# Patient Record
Sex: Female | Born: 1960 | Race: White | Hispanic: No | Marital: Married | Smoking: Former smoker
Health system: Southern US, Community
[De-identification: ages and names within clinical notes are randomized; demographics above are authoritative.]

## PROBLEM LIST (undated history)

## (undated) DIAGNOSIS — E162 Hypoglycemia, unspecified: Secondary | ICD-10-CM

## (undated) DIAGNOSIS — N2589 Other disorders resulting from impaired renal tubular function: Secondary | ICD-10-CM

## (undated) DIAGNOSIS — I4891 Unspecified atrial fibrillation: Secondary | ICD-10-CM

## (undated) DIAGNOSIS — E079 Disorder of thyroid, unspecified: Secondary | ICD-10-CM

## (undated) DIAGNOSIS — M5430 Sciatica, unspecified side: Secondary | ICD-10-CM

## (undated) DIAGNOSIS — G4730 Sleep apnea, unspecified: Secondary | ICD-10-CM

## (undated) DIAGNOSIS — I10 Essential (primary) hypertension: Secondary | ICD-10-CM

## (undated) DIAGNOSIS — I219 Acute myocardial infarction, unspecified: Secondary | ICD-10-CM

## (undated) HISTORY — PX: PLANTAR FASCIA RELEASE: SHX2239

## (undated) HISTORY — PX: RIGHT OOPHORECTOMY: SHX2359

## (undated) HISTORY — PX: BREAST CYST ASPIRATION: SHX578

## (undated) HISTORY — PX: DILATION AND CURETTAGE OF UTERUS: SHX78

## (undated) HISTORY — PX: CHOLECYSTECTOMY: SHX55

## (undated) HISTORY — PX: FRACTURE SURGERY: SHX138

## (undated) HISTORY — PX: HAND SURGERY: SHX662

## (undated) HISTORY — PX: APPENDECTOMY: SHX54

---

## 2015-06-27 ENCOUNTER — Other Ambulatory Visit: Payer: Self-pay | Admitting: Obstetrics and Gynecology

## 2015-06-27 DIAGNOSIS — N950 Postmenopausal bleeding: Secondary | ICD-10-CM

## 2015-07-09 ENCOUNTER — Ambulatory Visit: Payer: Self-pay

## 2015-07-18 ENCOUNTER — Ambulatory Visit
Admission: RE | Admit: 2015-07-18 | Discharge: 2015-07-18 | Disposition: A | Discharge status: Home / Self Care | Payer: BLUE CROSS/BLUE SHIELD | Source: Ambulatory Visit | Attending: Obstetrics and Gynecology | Admitting: Obstetrics and Gynecology

## 2015-07-18 DIAGNOSIS — N950 Postmenopausal bleeding: Secondary | ICD-10-CM | POA: Insufficient documentation

## 2015-07-18 DIAGNOSIS — Z780 Asymptomatic menopausal state: Secondary | ICD-10-CM | POA: Diagnosis not present

## 2015-09-11 ENCOUNTER — Ambulatory Visit
Admission: RE | Admit: 2015-09-11 | Discharge: 2015-09-11 | Disposition: A | Discharge status: Home / Self Care | Payer: BLUE CROSS/BLUE SHIELD | Source: Ambulatory Visit | Attending: Unknown Physician Specialty | Admitting: Unknown Physician Specialty

## 2015-09-11 ENCOUNTER — Other Ambulatory Visit: Payer: Self-pay | Admitting: Unknown Physician Specialty

## 2015-09-11 DIAGNOSIS — M79652 Pain in left thigh: Secondary | ICD-10-CM | POA: Insufficient documentation

## 2015-09-11 DIAGNOSIS — M25452 Effusion, left hip: Secondary | ICD-10-CM

## 2015-09-21 ENCOUNTER — Other Ambulatory Visit: Payer: Self-pay

## 2015-09-21 ENCOUNTER — Emergency Department: Payer: BLUE CROSS/BLUE SHIELD

## 2015-09-21 ENCOUNTER — Inpatient Hospital Stay
Admission: EM | Admit: 2015-09-21 | Discharge: 2015-09-23 | DRG: 287 | Disposition: A | Discharge status: Home / Self Care | Payer: BLUE CROSS/BLUE SHIELD | Attending: Internal Medicine | Admitting: Internal Medicine

## 2015-09-21 ENCOUNTER — Encounter: Payer: Self-pay | Admitting: Emergency Medicine

## 2015-09-21 DIAGNOSIS — R7989 Other specified abnormal findings of blood chemistry: Secondary | ICD-10-CM

## 2015-09-21 DIAGNOSIS — G4730 Sleep apnea, unspecified: Secondary | ICD-10-CM | POA: Diagnosis present

## 2015-09-21 DIAGNOSIS — R1013 Epigastric pain: Secondary | ICD-10-CM | POA: Diagnosis not present

## 2015-09-21 DIAGNOSIS — I214 Non-ST elevation (NSTEMI) myocardial infarction: Secondary | ICD-10-CM | POA: Diagnosis present

## 2015-09-21 DIAGNOSIS — E039 Hypothyroidism, unspecified: Secondary | ICD-10-CM | POA: Diagnosis present

## 2015-09-21 DIAGNOSIS — I480 Paroxysmal atrial fibrillation: Secondary | ICD-10-CM | POA: Diagnosis present

## 2015-09-21 DIAGNOSIS — Z800 Family history of malignant neoplasm of digestive organs: Secondary | ICD-10-CM

## 2015-09-21 DIAGNOSIS — I248 Other forms of acute ischemic heart disease: Principal | ICD-10-CM | POA: Diagnosis present

## 2015-09-21 DIAGNOSIS — Z9049 Acquired absence of other specified parts of digestive tract: Secondary | ICD-10-CM

## 2015-09-21 DIAGNOSIS — Z8249 Family history of ischemic heart disease and other diseases of the circulatory system: Secondary | ICD-10-CM

## 2015-09-21 DIAGNOSIS — M5430 Sciatica, unspecified side: Secondary | ICD-10-CM | POA: Diagnosis present

## 2015-09-21 DIAGNOSIS — R778 Other specified abnormalities of plasma proteins: Secondary | ICD-10-CM

## 2015-09-21 DIAGNOSIS — K30 Functional dyspepsia: Secondary | ICD-10-CM

## 2015-09-21 DIAGNOSIS — I10 Essential (primary) hypertension: Secondary | ICD-10-CM | POA: Diagnosis present

## 2015-09-21 DIAGNOSIS — Z9889 Other specified postprocedural states: Secondary | ICD-10-CM

## 2015-09-21 DIAGNOSIS — Z90721 Acquired absence of ovaries, unilateral: Secondary | ICD-10-CM

## 2015-09-21 DIAGNOSIS — K589 Irritable bowel syndrome without diarrhea: Secondary | ICD-10-CM | POA: Diagnosis present

## 2015-09-21 DIAGNOSIS — Z888 Allergy status to other drugs, medicaments and biological substances status: Secondary | ICD-10-CM

## 2015-09-21 DIAGNOSIS — Z87891 Personal history of nicotine dependence: Secondary | ICD-10-CM

## 2015-09-21 DIAGNOSIS — E669 Obesity, unspecified: Secondary | ICD-10-CM | POA: Diagnosis present

## 2015-09-21 HISTORY — DX: Essential (primary) hypertension: I10

## 2015-09-21 HISTORY — DX: Other disorders resulting from impaired renal tubular function: N25.89

## 2015-09-21 HISTORY — DX: Sleep apnea, unspecified: G47.30

## 2015-09-21 HISTORY — DX: Hypoglycemia, unspecified: E16.2

## 2015-09-21 HISTORY — DX: Disorder of thyroid, unspecified: E07.9

## 2015-09-21 HISTORY — DX: Unspecified atrial fibrillation: I48.91

## 2015-09-21 HISTORY — DX: Sciatica, unspecified side: M54.30

## 2015-09-21 LAB — TROPONIN I

## 2015-09-21 LAB — BASIC METABOLIC PANEL
ANION GAP: 9 (ref 5–15)
BUN: 15 mg/dL (ref 6–20)
CALCIUM: 9.1 mg/dL (ref 8.9–10.3)
CO2: 25 mmol/L (ref 22–32)
Chloride: 106 mmol/L (ref 101–111)
Creatinine, Ser: 0.82 mg/dL (ref 0.44–1.00)
Glucose, Bld: 102 mg/dL — ABNORMAL HIGH (ref 65–99)
POTASSIUM: 4.2 mmol/L (ref 3.5–5.1)
Sodium: 140 mmol/L (ref 135–145)

## 2015-09-21 LAB — CBC
HEMATOCRIT: 42.6 % (ref 35.0–47.0)
HEMOGLOBIN: 14.3 g/dL (ref 12.0–16.0)
MCH: 30.8 pg (ref 26.0–34.0)
MCHC: 33.6 g/dL (ref 32.0–36.0)
MCV: 91.6 fL (ref 80.0–100.0)
Platelets: 233 10*3/uL (ref 150–440)
RBC: 4.66 MIL/uL (ref 3.80–5.20)
RDW: 13.3 % (ref 11.5–14.5)
WBC: 7 10*3/uL (ref 3.6–11.0)

## 2015-09-21 NOTE — ED Notes (Signed)
Pt reports she was at the movies when she developed "chest discomfort" and a feeling of indigestion and feeling like she needed to have a BM. Pt reports she was dx'd with afib back in April. Pt is supposed to be on Eliquis but has not started taking it yet. Pt denies n/v, shortness of breath or diaphoresis with the discomfort.

## 2015-09-22 ENCOUNTER — Other Ambulatory Visit: Payer: Self-pay

## 2015-09-22 DIAGNOSIS — K589 Irritable bowel syndrome without diarrhea: Secondary | ICD-10-CM | POA: Diagnosis present

## 2015-09-22 DIAGNOSIS — Z9889 Other specified postprocedural states: Secondary | ICD-10-CM | POA: Diagnosis not present

## 2015-09-22 DIAGNOSIS — I248 Other forms of acute ischemic heart disease: Secondary | ICD-10-CM | POA: Diagnosis present

## 2015-09-22 DIAGNOSIS — G4730 Sleep apnea, unspecified: Secondary | ICD-10-CM | POA: Diagnosis present

## 2015-09-22 DIAGNOSIS — Z800 Family history of malignant neoplasm of digestive organs: Secondary | ICD-10-CM | POA: Diagnosis not present

## 2015-09-22 DIAGNOSIS — R1013 Epigastric pain: Secondary | ICD-10-CM | POA: Diagnosis present

## 2015-09-22 DIAGNOSIS — Z90721 Acquired absence of ovaries, unilateral: Secondary | ICD-10-CM | POA: Diagnosis not present

## 2015-09-22 DIAGNOSIS — M5430 Sciatica, unspecified side: Secondary | ICD-10-CM | POA: Diagnosis present

## 2015-09-22 DIAGNOSIS — Z888 Allergy status to other drugs, medicaments and biological substances status: Secondary | ICD-10-CM | POA: Diagnosis not present

## 2015-09-22 DIAGNOSIS — I214 Non-ST elevation (NSTEMI) myocardial infarction: Secondary | ICD-10-CM | POA: Diagnosis present

## 2015-09-22 DIAGNOSIS — E669 Obesity, unspecified: Secondary | ICD-10-CM | POA: Diagnosis present

## 2015-09-22 DIAGNOSIS — Z87891 Personal history of nicotine dependence: Secondary | ICD-10-CM | POA: Diagnosis not present

## 2015-09-22 DIAGNOSIS — I10 Essential (primary) hypertension: Secondary | ICD-10-CM | POA: Diagnosis present

## 2015-09-22 DIAGNOSIS — Z8249 Family history of ischemic heart disease and other diseases of the circulatory system: Secondary | ICD-10-CM | POA: Diagnosis not present

## 2015-09-22 DIAGNOSIS — Z9049 Acquired absence of other specified parts of digestive tract: Secondary | ICD-10-CM | POA: Diagnosis not present

## 2015-09-22 DIAGNOSIS — I480 Paroxysmal atrial fibrillation: Secondary | ICD-10-CM | POA: Diagnosis present

## 2015-09-22 DIAGNOSIS — E039 Hypothyroidism, unspecified: Secondary | ICD-10-CM | POA: Diagnosis present

## 2015-09-22 LAB — URINALYSIS COMPLETE WITH MICROSCOPIC (ARMC ONLY)
BILIRUBIN URINE: NEGATIVE
Glucose, UA: NEGATIVE mg/dL
Ketones, ur: NEGATIVE mg/dL
LEUKOCYTES UA: NEGATIVE
NITRITE: NEGATIVE
PH: 6 (ref 5.0–8.0)
PROTEIN: NEGATIVE mg/dL
Specific Gravity, Urine: 1.006 (ref 1.005–1.030)

## 2015-09-22 LAB — GLUCOSE, CAPILLARY
Glucose-Capillary: 120 mg/dL — ABNORMAL HIGH (ref 65–99)
Glucose-Capillary: 133 mg/dL — ABNORMAL HIGH (ref 65–99)
Glucose-Capillary: 120 mg/dL — ABNORMAL HIGH (ref 65–99)
Glucose-Capillary: 86 mg/dL (ref 65–99)

## 2015-09-22 LAB — HEPATIC FUNCTION PANEL
ALBUMIN: 4.4 g/dL (ref 3.5–5.0)
ALK PHOS: 75 U/L (ref 38–126)
ALT: 27 U/L (ref 14–54)
AST: 26 U/L (ref 15–41)
BILIRUBIN TOTAL: 0.3 mg/dL (ref 0.3–1.2)
Bilirubin, Direct: 0.1 mg/dL (ref 0.1–0.5)
Indirect Bilirubin: 0.2 mg/dL — ABNORMAL LOW (ref 0.3–0.9)
TOTAL PROTEIN: 7.3 g/dL (ref 6.5–8.1)

## 2015-09-22 LAB — TROPONIN I
Troponin I: 0.21 ng/mL — ABNORMAL HIGH (ref <0.031)
Troponin I: 0.56 ng/mL — ABNORMAL HIGH (ref <0.031)
Troponin I: 0.88 ng/mL — ABNORMAL HIGH (ref <0.031)
Troponin I: 0.71 ng/mL — ABNORMAL HIGH (ref <0.031)

## 2015-09-22 LAB — LIPASE, BLOOD: LIPASE: 29 U/L (ref 11–51)

## 2015-09-22 LAB — TSH: TSH: 4.315 u[IU]/mL (ref 0.350–4.500)

## 2015-09-22 LAB — HEMOGLOBIN A1C: Hgb A1c MFr Bld: 5.5 % (ref 4.0–6.0)

## 2015-09-22 MED ORDER — SODIUM CHLORIDE 0.9% FLUSH: 3.0000 mL | Freq: Two times a day (BID) | INTRAVENOUS | Status: DC

## 2015-09-22 MED ORDER — SODIUM CHLORIDE 0.9% FLUSH
3.0000 mL | Freq: Two times a day (BID) | INTRAVENOUS | Status: DC
  Administered 2015-09-22: 3 mL via INTRAVENOUS

## 2015-09-22 MED ORDER — ASPIRIN 81 MG PO CHEW
81.0000 mg | CHEWABLE_TABLET | ORAL | Status: AC
  Administered 2015-09-23: 81 mg via ORAL
  Filled 2015-09-22: qty 1

## 2015-09-22 MED ORDER — SODIUM CHLORIDE 0.9 % IV SOLN: 250.0000 mL | INTRAVENOUS | Status: DC | PRN

## 2015-09-22 MED ORDER — ONDANSETRON HCL 4 MG/2ML IJ SOLN: 4.0000 mg | Freq: Four times a day (QID) | INTRAMUSCULAR | Status: DC | PRN

## 2015-09-22 MED ORDER — SODIUM CHLORIDE 0.9 % WEIGHT BASED INFUSION
3.0000 mL/kg/h | INTRAVENOUS | Status: AC
  Administered 2015-09-23: 3 mL/kg/h via INTRAVENOUS

## 2015-09-22 MED ORDER — ACETAMINOPHEN 325 MG PO TABS: 650.0000 mg | ORAL_TABLET | Freq: Four times a day (QID) | ORAL | Status: DC | PRN

## 2015-09-22 MED ORDER — ASPIRIN 81 MG PO CHEW
CHEWABLE_TABLET | ORAL | Status: AC
  Administered 2015-09-22: 324 mg via ORAL
  Filled 2015-09-22: qty 4

## 2015-09-22 MED ORDER — DOCUSATE SODIUM 100 MG PO CAPS
100.0000 mg | ORAL_CAPSULE | Freq: Two times a day (BID) | ORAL | Status: DC
  Administered 2015-09-22: 100 mg via ORAL
  Filled 2015-09-22 (×2): qty 1

## 2015-09-22 MED ORDER — SODIUM CHLORIDE 0.9 % WEIGHT BASED INFUSION: 1.0000 mL/kg/h | INTRAVENOUS | Status: DC

## 2015-09-22 MED ORDER — MORPHINE SULFATE (PF) 2 MG/ML IV SOLN
2.0000 mg | INTRAVENOUS | Status: DC | PRN
  Filled 2015-09-22: qty 1

## 2015-09-22 MED ORDER — SODIUM CHLORIDE 0.9% FLUSH: 3.0000 mL | INTRAVENOUS | Status: DC | PRN

## 2015-09-22 MED ORDER — ASPIRIN 81 MG PO CHEW
324.0000 mg | CHEWABLE_TABLET | Freq: Once | ORAL | Status: AC
  Administered 2015-09-22: 324 mg via ORAL

## 2015-09-22 MED ORDER — ACETAMINOPHEN 650 MG RE SUPP: 650.0000 mg | Freq: Four times a day (QID) | RECTAL | Status: DC | PRN

## 2015-09-22 MED ORDER — ONDANSETRON HCL 4 MG PO TABS: 4.0000 mg | ORAL_TABLET | Freq: Four times a day (QID) | ORAL | Status: DC | PRN

## 2015-09-22 MED ORDER — ENOXAPARIN SODIUM 150 MG/ML ~~LOC~~ SOLN
1.0000 mg/kg | Freq: Two times a day (BID) | SUBCUTANEOUS | Status: DC
  Administered 2015-09-22 (×2): 145 mg via SUBCUTANEOUS
  Filled 2015-09-22 (×4): qty 0.96

## 2015-09-22 MED ORDER — LEVOTHYROXINE SODIUM 112 MCG PO TABS
112.0000 ug | ORAL_TABLET | Freq: Every day | ORAL | Status: DC
  Administered 2015-09-22 – 2015-09-23 (×2): 112 ug via ORAL
  Filled 2015-09-22 (×2): qty 1

## 2015-09-22 NOTE — ED Notes (Signed)
Called FN to allow this patient's friend to come back.

## 2015-09-22 NOTE — ED Notes (Signed)
Lab called in critical value of 0.21 Trop, notifying MD.

## 2015-09-22 NOTE — Progress Notes (Signed)
Michelle Rivers is a 55 y.o. female  [id number]  Primary Cardiologist: Adrian Blackwater Reason for Consultation: Chest pain  HPI: 55 year old white female with history of paroxysmal atrial fibrillation and hypothyroidism presented to the hospital with lower precordial chest pain bilaterally described as pressure type bicarbonate which was very tight. She had elevated troponin thus I was asked to evaluate the patient she denies any chest pain at this time.   Review of Systems: No orthopnea PND or leg swelling   Past Medical History  Diagnosis Date  . Sleep apnea   . Sciatica   . Atrial fibrillation (HCC)   . Hypoglycemia   . Renal tubular acidosis   . Renal tubular acidosis   . Hypertension   . Thyroid disease     Medications Prior to Admission  Medication Sig Dispense Refill  . acetaminophen (TYLENOL) 500 MG tablet Take 500 mg by mouth every 6 (six) hours as needed for mild pain, fever or headache.    . levothyroxine (SYNTHROID, LEVOTHROID) 112 MCG tablet Take 112 mcg by mouth daily.       Marland Kitchen docusate sodium  100 mg Oral BID  . enoxaparin (LOVENOX) injection  1 mg/kg Subcutaneous Q12H  . levothyroxine  112 mcg Oral QAC breakfast  . sodium chloride flush  3 mL Intravenous Q12H    Infusions:    Allergies  Allergen Reactions  . Demerol [Meperidine] Nausea And Vomiting    Social History   Social History  . Marital Status: Married    Spouse Name: N/A  . Number of Children: N/A  . Years of Education: N/A   Occupational History  . Not on file.   Social History Main Topics  . Smoking status: Former Games developer  . Smokeless tobacco: Not on file  . Alcohol Use: Not on file  . Drug Use: Not on file  . Sexual Activity: Not on file   Other Topics Concern  . Not on file   Social History Narrative    Family History  Problem Relation Age of Onset  . CAD Mother   . CAD Father   . Liver cancer Brother     PHYSICAL EXAM: Filed Vitals:   09/22/15 0349 09/22/15 0800   BP: 145/43 117/47  Pulse: 73 69  Temp: 97.5 F (36.4 C) 98.2 F (36.8 C)  Resp: 16 16    No intake or output data in the 24 hours ending 09/22/15 1103  General:  Well appearing. No respiratory difficulty HEENT: normal Neck: supple. no JVD. Carotids 2+ bilat; no bruits. No lymphadenopathy or thryomegaly appreciated. Cor: PMI nondisplaced. Regular rate & rhythm. No rubs, gallops or murmurs. Lungs: clear Abdomen: soft, nontender, nondistended. No hepatosplenomegaly. No bruits or masses. Good bowel sounds. Extremities: no cyanosis, clubbing, rash, edema Neuro: alert & oriented x 3, cranial nerves grossly intact. moves all 4 extremities w/o difficulty. Affect pleasant.  ECG: Sinus rhythm no acute changes  Results for orders placed or performed during the hospital encounter of 09/21/15 (from the past 24 hour(s))  Basic metabolic panel     Status: Abnormal   Collection Time: 09/21/15  8:53 PM  Result Value Ref Range   Sodium 140 135 - 145 mmol/L   Potassium 4.2 3.5 - 5.1 mmol/L   Chloride 106 101 - 111 mmol/L   CO2 25 22 - 32 mmol/L   Glucose, Bld 102 (H) 65 - 99 mg/dL   BUN 15 6 - 20 mg/dL   Creatinine, Ser 4.09 0.44 - 1.00  mg/dL   Calcium 9.1 8.9 - 24.4 mg/dL   GFR calc non Af Amer >60 >60 mL/min   GFR calc Af Amer >60 >60 mL/min   Anion gap 9 5 - 15  CBC     Status: None   Collection Time: 09/21/15  8:53 PM  Result Value Ref Range   WBC 7.0 3.6 - 11.0 K/uL   RBC 4.66 3.80 - 5.20 MIL/uL   Hemoglobin 14.3 12.0 - 16.0 g/dL   HCT 62.8 63.8 - 17.7 %   MCV 91.6 80.0 - 100.0 fL   MCH 30.8 26.0 - 34.0 pg   MCHC 33.6 32.0 - 36.0 g/dL   RDW 11.6 57.9 - 03.8 %   Platelets 233 150 - 440 K/uL  Troponin I     Status: None   Collection Time: 09/21/15  8:53 PM  Result Value Ref Range   Troponin I <0.03 <0.031 ng/mL  Hepatic function panel     Status: Abnormal   Collection Time: 09/21/15  8:53 PM  Result Value Ref Range   Total Protein 7.3 6.5 - 8.1 g/dL   Albumin 4.4 3.5 - 5.0  g/dL   AST 26 15 - 41 U/L   ALT 27 14 - 54 U/L   Alkaline Phosphatase 75 38 - 126 U/L   Total Bilirubin 0.3 0.3 - 1.2 mg/dL   Bilirubin, Direct 0.1 0.1 - 0.5 mg/dL   Indirect Bilirubin 0.2 (L) 0.3 - 0.9 mg/dL  Lipase, blood     Status: None   Collection Time: 09/21/15  8:53 PM  Result Value Ref Range   Lipase 29 11 - 51 U/L  Urinalysis complete, with microscopic ([facility] only)     Status: Abnormal   Collection Time: 09/22/15 12:31 AM  Result Value Ref Range   Color, Urine STRAW (A) YELLOW   APPearance CLEAR (A) CLEAR   Glucose, UA NEGATIVE NEGATIVE mg/dL   Bilirubin Urine NEGATIVE NEGATIVE   Ketones, ur NEGATIVE NEGATIVE mg/dL   Specific Gravity, Urine 1.006 1.005 - 1.030   Hgb urine dipstick 1+ (A) NEGATIVE   pH 6.0 5.0 - 8.0   Protein, ur NEGATIVE NEGATIVE mg/dL   Nitrite NEGATIVE NEGATIVE   Leukocytes, UA NEGATIVE NEGATIVE   RBC / HPF 0-5 0 - 5 RBC/hpf   WBC, UA 0-5 0 - 5 WBC/hpf   Bacteria, UA RARE (A) NONE SEEN   Squamous Epithelial / LPF 0-5 (A) NONE SEEN  Troponin I     Status: Abnormal   Collection Time: 09/22/15 12:45 AM  Result Value Ref Range   Troponin I 0.21 (H) <0.031 ng/mL  Glucose, capillary     Status: Abnormal   Collection Time: 09/22/15  3:54 AM  Result Value Ref Range   Glucose-Capillary 120 (H) 65 - 99 mg/dL  TSH     Status: None   Collection Time: 09/22/15  5:18 AM  Result Value Ref Range   TSH 4.315 0.350 - 4.500 uIU/mL  Troponin I     Status: Abnormal   Collection Time: 09/22/15  5:18 AM  Result Value Ref Range   Troponin I 0.56 (H) <0.031 ng/mL  Glucose, capillary     Status: Abnormal   Collection Time: 09/22/15  6:07 AM  Result Value Ref Range   Glucose-Capillary 133 (H) 65 - 99 mg/dL  Troponin I     Status: Abnormal   Collection Time: 09/22/15  9:42 AM  Result Value Ref Range   Troponin I 0.88 (H) <0.031 ng/mL  Dg Chest 2 View  09/21/2015  CLINICAL DATA:  Pt reports she was at the movies when she developed "chest discomfort" and a  feeling of indigestion and feeling like she needed to have a BM. Pt reports she was dx'd with afib back in April. Pt is supposed to be on Eliquis but has not started taking it yet. Pt denies n/v, shortness of breath or diaphoresis with the discomfort EXAM: CHEST  2 VIEW COMPARISON:  None. FINDINGS: The heart size and mediastinal contours are within normal limits. Both lungs are clear. No pleural effusion or pneumothorax. The visualized skeletal structures are unremarkable. IMPRESSION: No active cardiopulmonary disease. Electronically Signed   By: Amie Portland M.D.   On: 09/21/2015 21:45     ASSESSMENT AND PLAN:Non-STEMI with elevated troponin no ST changes on EKG discussed fully with the patient about risk and benefits of cardiac catheterization and patient has agreed to having cardiac catheterization which will be set up tomorrow afternoon.  Sinead Hockman A

## 2015-09-22 NOTE — ED Notes (Signed)
PT was taken to room 234.

## 2015-09-22 NOTE — H&P (Signed)
Michelle Rivers is an 55 y.o. female.   Chief Complaint: Chest tightness HPI: The patient with past medical history of paroxysmal A. fib, hypothyroidism and IBS presents emergency department complaining of chest tightness. She states that she been seeing a movie and had returned from the restroom after walking up the slight incline to her seat when she felt tightness in her chest and her epigastrium that she states felt like "her bra strap was too tight". She states the feeling lasted for approximately 15 minutes. There was no radiation of the discomfort. She denies any associated shortness of breath, nausea, vomiting or diaphoresis. The patient also denies palpitations. She admits to feeling as if it may be indigestion and belched a number of times. This did not specifically relieve her pain. Notably, the patient states that she felt as if she may have to have a bowel movement and that moment. This sensation dissipated momentarily and lasted a much shorter time than her chest tightness. She had another episode of chest tightness in the emergency department. This time she was able to have a bowel movement that she states was normal in color, caliber and consistency. She admits that the volume of stool was slightly more than usual. Also, she admits to having eaten a large amount of blueberries, blackberries and raspberries today. Notably she denies any abdominal pain. Laboratory evaluation in the emergency department revealed an elevated troponin. Due to her vague symptoms and elevated troponin in the emergency department staff called for admission.  Past Medical History  Diagnosis Date  . Sleep apnea   . Sciatica   . Atrial fibrillation ([place])   . Hypoglycemia   . Renal tubular acidosis   . Renal tubular acidosis   . Hypertension   . Thyroid disease     Past Surgical History  Procedure Laterality Date  . Cholecystectomy    . Right oophorectomy    . Dilation and curettage of uterus    .  Appendectomy    . Fracture surgery    . Hand surgery    . Plantar fascia release      Family History  Problem Relation Age of Onset  . CAD Mother   . CAD Father   . Liver cancer Brother    Social History:  reports that she has quit smoking. She does not have any smokeless tobacco history on file. Her alcohol and drug histories are not on file.  Allergies:  Allergies  Allergen Reactions  . Demerol [Meperidine] Nausea And Vomiting    Medications Prior to Admission  Medication Sig Dispense Refill  . acetaminophen (TYLENOL) 500 MG tablet Take 500 mg by mouth every 6 (six) hours as needed for mild pain, fever or headache.    . levothyroxine (SYNTHROID, LEVOTHROID) 112 MCG tablet Take 112 mcg by mouth daily.      Results for orders placed or performed during the hospital encounter of 09/21/15 (from the past 48 hour(s))  Basic metabolic panel     Status: Abnormal   Collection Time: 09/21/15  8:53 PM  Result Value Ref Range   Sodium 140 135 - 145 mmol/L   Potassium 4.2 3.5 - 5.1 mmol/L   Chloride 106 101 - 111 mmol/L   CO2 25 22 - 32 mmol/L   Glucose, Bld 102 (H) 65 - 99 mg/dL   BUN 15 6 - 20 mg/dL   Creatinine, Ser 0.82 0.44 - 1.00 mg/dL   Calcium 9.1 8.9 - 10.3 mg/dL   GFR calc non Af  Amer >60 >60 mL/min   GFR calc Af Amer >60 >60 mL/min    Comment: (NOTE) The eGFR has been calculated using the CKD EPI equation. This calculation has not been validated in all clinical situations. eGFR's persistently <60 mL/min signify possible Chronic Kidney Disease.    Anion gap 9 5 - 15  CBC     Status: None   Collection Time: 09/21/15  8:53 PM  Result Value Ref Range   WBC 7.0 3.6 - 11.0 K/uL   RBC 4.66 3.80 - 5.20 MIL/uL   Hemoglobin 14.3 12.0 - 16.0 g/dL   HCT 42.6 35.0 - 47.0 %   MCV 91.6 80.0 - 100.0 fL   MCH 30.8 26.0 - 34.0 pg   MCHC 33.6 32.0 - 36.0 g/dL   RDW 13.3 11.5 - 14.5 %   Platelets 233 150 - 440 K/uL  Troponin I     Status: None   Collection Time: 09/21/15  8:53  PM  Result Value Ref Range   Troponin I <0.03 <0.031 ng/mL    Comment:        NO INDICATION OF MYOCARDIAL INJURY.   Hepatic function panel     Status: Abnormal   Collection Time: 09/21/15  8:53 PM  Result Value Ref Range   Total Protein 7.3 6.5 - 8.1 g/dL   Albumin 4.4 3.5 - 5.0 g/dL   AST 26 15 - 41 U/L   ALT 27 14 - 54 U/L   Alkaline Phosphatase 75 38 - 126 U/L   Total Bilirubin 0.3 0.3 - 1.2 mg/dL   Bilirubin, Direct 0.1 0.1 - 0.5 mg/dL   Indirect Bilirubin 0.2 (L) 0.3 - 0.9 mg/dL  Lipase, blood     Status: None   Collection Time: 09/21/15  8:53 PM  Result Value Ref Range   Lipase 29 11 - 51 U/L  Urinalysis complete, with microscopic ([facility] only)     Status: Abnormal   Collection Time: 09/22/15 12:31 AM  Result Value Ref Range   Color, Urine STRAW (A) YELLOW   APPearance CLEAR (A) CLEAR   Glucose, UA NEGATIVE NEGATIVE mg/dL   Bilirubin Urine NEGATIVE NEGATIVE   Ketones, ur NEGATIVE NEGATIVE mg/dL   Specific Gravity, Urine 1.006 1.005 - 1.030   Hgb urine dipstick 1+ (A) NEGATIVE   pH 6.0 5.0 - 8.0   Protein, ur NEGATIVE NEGATIVE mg/dL   Nitrite NEGATIVE NEGATIVE   Leukocytes, UA NEGATIVE NEGATIVE   RBC / HPF 0-5 0 - 5 RBC/hpf   WBC, UA 0-5 0 - 5 WBC/hpf   Bacteria, UA RARE (A) NONE SEEN   Squamous Epithelial / LPF 0-5 (A) NONE SEEN  Troponin I     Status: Abnormal   Collection Time: 09/22/15 12:45 AM  Result Value Ref Range   Troponin I 0.21 (H) <0.031 ng/mL    Comment: READ BACK AND VERIFIED WITH DAVID WALKER AT 0114 09/22/15.PMH        PERSISTENTLY INCREASED TROPONIN VALUES IN THE RANGE OF 0.04-0.49 ng/mL CAN BE SEEN IN:       -UNSTABLE ANGINA       -CONGESTIVE HEART FAILURE       -MYOCARDITIS       -CHEST TRAUMA       -ARRYHTHMIAS       -LATE PRESENTING MYOCARDIAL INFARCTION       -COPD   CLINICAL FOLLOW-UP RECOMMENDED.   Glucose, capillary     Status: Abnormal   Collection Time: 09/22/15  3:54 AM  Result Value Ref Range   Glucose-Capillary 120 (H) 65 -  99 mg/dL  Glucose, capillary     Status: Abnormal   Collection Time: 09/22/15  6:07 AM  Result Value Ref Range   Glucose-Capillary 133 (H) 65 - 99 mg/dL   Dg Chest 2 View  09/21/2015  CLINICAL DATA:  Pt reports she was at the movies when she developed "chest discomfort" and a feeling of indigestion and feeling like she needed to have a BM. Pt reports she was dx'd with afib back in April. Pt is supposed to be on Eliquis but has not started taking it yet. Pt denies n/v, shortness of breath or diaphoresis with the discomfort EXAM: CHEST  2 VIEW COMPARISON:  None. FINDINGS: The heart size and mediastinal contours are within normal limits. Both lungs are clear. No pleural effusion or pneumothorax. The visualized skeletal structures are unremarkable. IMPRESSION: No active cardiopulmonary disease. Electronically Signed   By: Lajean Manes M.D.   On: 09/21/2015 21:45    Review of Systems  Constitutional: Negative for fever and chills.  HENT: Negative for sore throat and tinnitus.   Eyes: Negative for blurred vision and redness.  Respiratory: Negative for cough and shortness of breath.   Cardiovascular: Negative for chest pain (tightness; see HPI), palpitations, orthopnea and PND.  Gastrointestinal: Negative for nausea, vomiting, abdominal pain and diarrhea.  Genitourinary: Negative for dysuria, urgency and frequency.  Musculoskeletal: Negative for myalgias and joint pain.  Skin: Negative for rash.       No lesions  Neurological: Negative for speech change, focal weakness and weakness.  Endo/Heme/Allergies: Does not bruise/bleed easily.       No temperature intolerance  Psychiatric/Behavioral: Negative for depression and suicidal ideas.    Blood pressure 145/43, pulse 73, temperature 97.5 F (36.4 C), temperature source Oral, resp. rate 16, height '5\' 6"'  (1.676 m), weight 144.198 kg (317 lb 14.4 oz), SpO2 97 %. Physical Exam  Constitutional: She is oriented to person, place, and time. She appears  well-developed and well-nourished. No distress.  HENT:  Head: Normocephalic and atraumatic.  Mouth/Throat: Oropharynx is clear and moist.  Eyes: Conjunctivae and EOM are normal. Pupils are equal, round, and reactive to light. No scleral icterus.  Neck: Normal range of motion. Neck supple. No JVD present. No tracheal deviation present. No thyromegaly present.  Cardiovascular: Normal rate, regular rhythm and normal heart sounds.  Exam reveals no gallop and no friction rub.   No murmur heard. Respiratory: Effort normal and breath sounds normal. No respiratory distress.  GI: Soft. Bowel sounds are normal. She exhibits no distension. There is no tenderness.  Genitourinary:  Deferred  Musculoskeletal: Normal range of motion. She exhibits edema.  Lymphadenopathy:    She has no cervical adenopathy.  Neurological: She is alert and oriented to person, place, and time. No cranial nerve deficit.  Skin: Skin is warm and dry. No rash noted. No erythema.  Psychiatric: She has a normal mood and affect. Her behavior is normal. Judgment and thought content normal.     Assessment/Plan This is a 55 year old female admitted for NSTEMI. 1. NSTEMI: Elevation in troponin without indications of ischemia on EKG. The patient denies any ongoing chest pain although she has had another episode of feeling as if she may have to have diarrhea. Place the patient on telemetry. Follow cardiac enzymes area start therapeutic Lovenox. Cardiology consulted. 2. Hypothyroidism: Continue Synthroid 3. DVT prophylaxis: As above 4. GI prophylaxis: None The patient is a full code. Time spent  on admission was inpatient care approximately 45 minutes  Harrie Foreman, MD 09/22/2015, 6:10 AM

## 2015-09-22 NOTE — ED Provider Notes (Signed)
[facility] Emergency Department Provider Note   ____________________________________________  Time seen: Approximately 0000 AM  I have reviewed the triage vital signs and the nursing notes.   HISTORY  Chief Complaint Chest Pain    HPI Michelle Rivers is a 55 y.o. female who comes into the hospital today with some pain in her lower chest. The patient reports that she was at the movies tonight and had about 5 piece of popcorn. She went to the bathroom and reports that she started having some indigestion. The patient felt as though her bra strap is too tight. She reports that she would not call it chest pain but more discomfort in her lower chest or upper abdomen. The patient has a history of atrial fibrillation but has not been taking her medicines because she is nervous about taking pills. The patient did take 2 Gaviscon for her indigestion but reports that it did not help her symptoms as quickly as typical. The patient reports that she was belching a lot as well. The patient decided to come into the hospital to get checked out. She reports that while she was waiting the symptoms improved. The patient denies any shortness of breath any sweats any nausea or vomiting. She reports that she did have an urge to have a bowel movement but that was the only other symptom she had. The patient is here for evaluation.   Past Medical History  Diagnosis Date  . Sleep apnea   . Sciatica   . Atrial fibrillation (HCC)   . Hypoglycemia   . Renal tubular acidosis   . Renal tubular acidosis   . Hypertension   . Thyroid disease     Patient Active Problem List   Diagnosis Date Noted  . NSTEMI (non-ST elevated myocardial infarction) (HCC) 09/22/2015    Past Surgical History  Procedure Laterality Date  . Cholecystectomy    . Right oophorectomy    . Dilation and curettage of uterus    . Appendectomy    . Fracture surgery    . Hand surgery    . Plantar fascia release        Current Outpatient Rx  Name  Route  Sig  Dispense  Refill  . acetaminophen (TYLENOL) 500 MG tablet   Oral   Take 500 mg by mouth every 6 (six) hours as needed for mild pain, fever or headache.         . levothyroxine (SYNTHROID, LEVOTHROID) 112 MCG tablet   Oral   Take 112 mcg by mouth daily.           Allergies Demerol  No family history on file.  Social History Social History  Substance Use Topics  . Smoking status: Former   . Smokeless tobacco: None  . Alcohol Use: None    Review of Systems Constitutional: No fever/chills Eyes: No visual changes. ENT: No sore throat. Cardiovascular: Denies chest pain. Respiratory: Denies shortness of breath. Gastrointestinal: Indigestion, No abdominal pain.  No nausea, no vomiting.  No diarrhea.  No constipation. Genitourinary: Negative for dysuria. Musculoskeletal: Negative for back pain. Skin: Negative for rash. Neurological: Negative for headaches, focal weakness or numbness.  10-point ROS otherwise negative.  ____________________________________________   PHYSICAL EXAM:  VITAL SIGNS: ED Triage Vitals  Enc Vitals Group     BP 09/21/15 2035 171/68 mmHg     Pulse Rate 09/21/15 2035 81     Resp 09/21/15 2035 18     Temp 09/21/15 2035 97.7 F (36.5  C)     Temp Source 09/21/15 2035 Oral     SpO2 09/21/15 2035 97 %     Weight 09/21/15 2035 315 lb (142.883 kg)     Height 09/21/15 2035 5\' 6"  (1.676 m)     Head Cir --      Peak Flow --      Pain Score 09/21/15 2036 5     Pain Loc --      Pain Edu? --      Excl. in GC? --     Constitutional: Alert and oriented. Well appearing and in no acute distress. Eyes: Conjunctivae are normal. PERRL. EOMI. Head: Atraumatic. Nose: No congestion/rhinnorhea. Mouth/Throat: Mucous membranes are moist.  Oropharynx non-erythematous. Cardiovascular: Normal rate, regular rhythm. Grossly normal heart sounds.  Good peripheral circulation. Respiratory: Normal respiratory effort.   No retractions. Lungs CTAB. Gastrointestinal: Soft and nontender. No distention. Positive bowel sounds Musculoskeletal: No lower extremity tenderness nor edema.  Neurologic:  Normal speech and language.  Skin:  Skin is warm, dry and intact.  Psychiatric: Mood and affect are normal.   ____________________________________________   LABS (all labs ordered are listed, but only abnormal results are displayed)  Labs Reviewed  BASIC METABOLIC PANEL - Abnormal; Notable for the following:    Glucose, Bld 102 (*)    All other components within normal limits  HEPATIC FUNCTION PANEL - Abnormal; Notable for the following:    Indirect Bilirubin 0.2 (*)    All other components within normal limits  TROPONIN I - Abnormal; Notable for the following:    Troponin I 0.21 (*)    All other components within normal limits  URINALYSIS COMPLETEWITH MICROSCOPIC ([facility] ONLY) - Abnormal; Notable for the following:    Color, Urine STRAW (*)    APPearance CLEAR (*)    Hgb urine dipstick 1+ (*)    Bacteria, UA RARE (*)    Squamous Epithelial / LPF 0-5 (*)    All other components within normal limits  CBC  TROPONIN I  LIPASE, BLOOD   ____________________________________________  EKG  ED ECG REPORT #1 I, Rebecka Apley, the attending physician, personally viewed and interpreted this ECG.   Date: 09/21/2015  EKG Time: 2035  Rate: 76  Rhythm: normal sinus rhythm  Axis: normal  Intervals:none  ST&T Change: none   ED ECG REPORT #2 I, Rebecka Apley, the attending physician, personally viewed and interpreted this ECG.   Date: 09/22/2015  EKG Time: 120  Rate: 83  Rhythm: normal sinus rhythm  Axis: normal  Intervals:none  ST&T Change: none  ____________________________________________  RADIOLOGY  Chest x-ray: No active cardiopulmonary disease ____________________________________________   PROCEDURES  Procedure(s) performed: None  Critical Care performed:  No  ____________________________________________   INITIAL IMPRESSION / ASSESSMENT AND PLAN / ED COURSE  Pertinent labs & imaging results that were available during my care of the patient were reviewed by me and considered in my medical decision making (see chart for details).  This is a 55 year old female who comes into the hospital today with some indigestion and discomfort in her upper abdomen/lower chest. The patient reports that her symptoms are gone at this time. Her initial blood work was unremarkable. The decision was made to repeat the troponin to ensure that this is not cardiac in nature. The patient's repeat troponin though returned at 0.21. The patient will be admitted to the hospitalist service. I did give the patient some aspirin. ____________________________________________   FINAL CLINICAL IMPRESSION(S) / ED DIAGNOSES  Final diagnoses:  Indigestion  Elevated troponin      NEW MEDICATIONS STARTED DURING THIS VISIT:  New Prescriptions   No medications on file     Note:  This document was prepared using Dragon voice recognition software and may include unintentional dictation errors.    Rebecka Apley, MD 09/22/15 [phone]

## 2015-09-22 NOTE — Progress Notes (Signed)
Pt to have cardiac cath to [facility]row. However, before signing consent form she wishes to speak with dr. Harriette Ohara again to ask him a few questions. i told her she could ask him in the am. In specials. She has been painfree all day. Up in chair. Several visitors at bedside.

## 2015-09-22 NOTE — Progress Notes (Signed)
Patient is in good spirits, but is concerned about her health and the continued issues she has had with her heart. Patient told Chaplain her husband is in [place], but she wants to make sure she get Advance Directive done so that her family and other loved ones don't have to make hard decisions just in case something happens. She has recently had to deal with the death of her brother and seeing his wife and what the family went through has prompted her to make sound decisions beforehand. Chaplain care team will follow up and see if she would like to complete the Advance Directive today.

## 2015-09-22 NOTE — Progress Notes (Signed)
ANTICOAGULATION CONSULT NOTE - Initial Consult  Pharmacy Consult for Lovenox dosing Indication: ACS / NSTEMI / CP  Allergies  Allergen Reactions  . Demerol [Meperidine] Nausea And Vomiting    Patient Measurements: Height: 5\' 6"  (167.6 cm) Weight: (!) 317 lb 14.4 oz (144.198 kg) IBW/kg (Calculated) : 59.3 Heparin Dosing Weight: 144kg  Vital Signs: Temp: 97.5 F (36.4 C) (06/18 0349) Temp Source: Oral (06/18 0349) BP: 145/43 mmHg (06/18 0349) Pulse Rate: 73 (06/18 0349)  Labs:  Recent Labs  09/21/15 2053 09/22/15 0045  HGB 14.3  --   HCT 42.6  --   PLT 233  --   CREATININE 0.82  --   TROPONINI <0.03 0.21*    Estimated Creatinine Clearance: 112.8 mL/min (by C-G formula based on Cr of 0.82).   Medical History: Past Medical History  Diagnosis Date  . Sleep apnea   . Sciatica   . Atrial fibrillation (HCC)   . Hypoglycemia   . Renal tubular acidosis   . Renal tubular acidosis   . Hypertension   . Thyroid disease     Medications:    Assessment:  Goal of Therapy:   Monitor platelets by anticoagulation protocol: Yes   Plan:  Lovenox 1 mg/kg q 12 hours ordered. F/u labs per protocol.  Brunetta Newingham S 09/22/2015,4:10 AM

## 2015-09-22 NOTE — ED Notes (Addendum)
Unsuccessful IV start x4, (2 non-US) (2 [place]), asking Barbette Hair to attempt.  Patient gave me permission to attempt more than 2 times.

## 2015-09-22 NOTE — ED Notes (Signed)
MD at bedside. 

## 2015-09-23 ENCOUNTER — Inpatient Hospital Stay: Admit: 2015-09-23 | Payer: BLUE CROSS/BLUE SHIELD

## 2015-09-23 ENCOUNTER — Encounter
Admission: EM | Disposition: A | Discharge status: Home / Self Care | Payer: Self-pay | Source: Home / Self Care | Attending: Internal Medicine

## 2015-09-23 ENCOUNTER — Encounter: Payer: Self-pay | Admitting: Cardiovascular Disease

## 2015-09-23 HISTORY — PX: CARDIAC CATHETERIZATION: SHX172

## 2015-09-23 LAB — GLUCOSE, CAPILLARY
Glucose-Capillary: 101 mg/dL — ABNORMAL HIGH (ref 65–99)
Glucose-Capillary: 93 mg/dL (ref 65–99)

## 2015-09-23 LAB — PROTIME-INR
INR: 1.04
PROTHROMBIN TIME: 13.8 s (ref 11.4–15.0)

## 2015-09-23 SURGERY — LEFT HEART CATH AND CORONARY ANGIOGRAPHY
Anesthesia: Moderate Sedation | Laterality: Right

## 2015-09-23 MED ORDER — ONDANSETRON HCL 4 MG/2ML IJ SOLN: 4.0000 mg | Freq: Four times a day (QID) | INTRAMUSCULAR | Status: DC | PRN

## 2015-09-23 MED ORDER — SODIUM CHLORIDE 0.9% FLUSH: 3.0000 mL | INTRAVENOUS | Status: DC | PRN

## 2015-09-23 MED ORDER — ISOSORBIDE MONONITRATE ER 30 MG PO TB24: 30.0000 mg | ORAL_TABLET | Freq: Every day | ORAL | Status: AC

## 2015-09-23 MED ORDER — HEPARIN (PORCINE) IN NACL 2-0.9 UNIT/ML-% IJ SOLN
INTRAMUSCULAR | Status: AC
  Filled 2015-09-23: qty 500

## 2015-09-23 MED ORDER — FENTANYL CITRATE (PF) 100 MCG/2ML IJ SOLN
INTRAMUSCULAR | Status: AC
  Filled 2015-09-23: qty 2

## 2015-09-23 MED ORDER — CLOPIDOGREL BISULFATE 75 MG PO TABS: 75.0000 mg | ORAL_TABLET | Freq: Every day | ORAL | Status: DC

## 2015-09-23 MED ORDER — SODIUM CHLORIDE 0.9 % IV SOLN: 250.0000 mL | INTRAVENOUS | Status: DC | PRN

## 2015-09-23 MED ORDER — SODIUM CHLORIDE 0.9% FLUSH: 3.0000 mL | Freq: Two times a day (BID) | INTRAVENOUS | Status: DC

## 2015-09-23 MED ORDER — ACETAMINOPHEN 325 MG PO TABS: 650.0000 mg | ORAL_TABLET | ORAL | Status: DC | PRN

## 2015-09-23 MED ORDER — FENTANYL CITRATE (PF) 100 MCG/2ML IJ SOLN
INTRAMUSCULAR | Status: DC | PRN
  Administered 2015-09-23: 12.5 ug via INTRAVENOUS

## 2015-09-23 MED ORDER — ISOSORBIDE MONONITRATE ER 30 MG PO TB24: 30.0000 mg | ORAL_TABLET | Freq: Every day | ORAL | Status: DC

## 2015-09-23 MED ORDER — DEXTROSE 50 % IV SOLN
INTRAVENOUS | Status: AC
  Administered 2015-09-23: 12.5 mL via INTRAVENOUS
  Filled 2015-09-23: qty 50

## 2015-09-23 MED ORDER — SODIUM CHLORIDE 0.9 % WEIGHT BASED INFUSION: 1.0000 mL/kg/h | INTRAVENOUS | Status: AC

## 2015-09-23 MED ORDER — CLOPIDOGREL BISULFATE 75 MG PO TABS
300.0000 mg | ORAL_TABLET | Freq: Once | ORAL | Status: AC
  Administered 2015-09-23: 300 mg via ORAL
  Filled 2015-09-23: qty 4

## 2015-09-23 MED ORDER — ISOSORBIDE MONONITRATE ER 30 MG PO TB24
30.0000 mg | ORAL_TABLET | Freq: Every day | ORAL | Status: DC
  Administered 2015-09-23: 30 mg via ORAL
  Filled 2015-09-23 (×2): qty 1

## 2015-09-23 MED ORDER — MIDAZOLAM HCL 2 MG/2ML IJ SOLN
INTRAMUSCULAR | Status: DC | PRN
  Administered 2015-09-23: 0.5 mg via INTRAVENOUS
  Administered 2015-09-23: 2 mg via INTRAVENOUS

## 2015-09-23 MED ORDER — MIDAZOLAM HCL 2 MG/2ML IJ SOLN
INTRAMUSCULAR | Status: AC
  Filled 2015-09-23: qty 2

## 2015-09-23 MED ORDER — ASPIRIN EC 81 MG PO TBEC: 81.0000 mg | DELAYED_RELEASE_TABLET | Freq: Every day | ORAL | Status: DC

## 2015-09-23 MED ORDER — IOPAMIDOL (ISOVUE-300) INJECTION 61%
INTRAVENOUS | Status: DC | PRN
  Administered 2015-09-23: 120 mL via INTRAVENOUS

## 2015-09-23 MED ORDER — ASPIRIN EC 81 MG PO TBEC: 81.0000 mg | DELAYED_RELEASE_TABLET | Freq: Every day | ORAL | Status: AC

## 2015-09-23 MED ORDER — DEXTROSE 50 % IV SOLN: 12.5000 mL | Freq: Once | INTRAVENOUS | Status: DC

## 2015-09-23 SURGICAL SUPPLY — 10 items
CATH INFINITI 5FR ANG PIGTAIL (CATHETERS) ×1 IMPLANT
CATH INFINITI 5FR JL4 (CATHETERS) ×1 IMPLANT
CATH INFINITI JR4 5F (CATHETERS) ×1 IMPLANT
DEVICE CLOSURE MYNXGRIP 5F (Vascular Products) ×1 IMPLANT
KIT MANI 3VAL PERCEP (MISCELLANEOUS) ×2 IMPLANT
NDL PERC 18GX7CM (NEEDLE) IMPLANT
NEEDLE PERC 18GX7CM (NEEDLE) ×2 IMPLANT
PACK CARDIAC CATH (CUSTOM PROCEDURE TRAY) ×2 IMPLANT
SHEATH AVANTI 5FR X 11CM (SHEATH) ×1 IMPLANT
WIRE EMERALD 3MM-J .035X150CM (WIRE) ×1 IMPLANT

## 2015-09-23 NOTE — Progress Notes (Signed)
FSB 93  Dr. Welton Flakes notified.  12.5 ml of 50% Dextrose administered as ordered

## 2015-09-23 NOTE — Progress Notes (Signed)
Report to susan telemetry.  Check right groin for bleeding or hematoma.  Patient will be on bedrest for 1 hours post sheath pull---out of bed at 1320.  Bilateral pulses are 2's DP's.Michelle Rivers

## 2015-09-23 NOTE — Progress Notes (Signed)
  SUBJECTIVE: The patient is for cardiac catheterization this morning. She is worried about her blood sugar dropping and is reassured she will be taking care of. No chest pain or pressure or shortness of breath through the night. She rested well.   Filed Vitals:   09/22/15 0800 09/22/15 1226 09/22/15 1917 09/23/15 0606  BP: 117/47 126/56 140/60 132/52  Pulse: 69 69 75 72  Temp: 98.2 F (36.8 C) 98.3 F (36.8 C) 98.2 F (36.8 C) 98.3 F (36.8 C)  TempSrc: Oral Oral Oral Oral  Resp: 16 20 18 20   Height:      Weight:    316 lb 1.6 oz (143.382 kg)  SpO2: 98% 99% 97% 96%    Intake/Output Summary (Last 24 hours) at 09/23/15 0823 Last data filed at 09/22/15 2202  Gross per 24 hour  Intake    240 ml  Output   2750 ml  Net  -2510 ml    LABS: Basic Metabolic Panel:  Recent Labs  59/97/74 2053  NA 140  K 4.2  CL 106  CO2 25  GLUCOSE 102*  BUN 15  CREATININE 0.82  CALCIUM 9.1   Liver Function Tests:  Recent Labs  09/21/15 2053  AST 26  ALT 27  ALKPHOS 75  BILITOT 0.3  PROT 7.3  ALBUMIN 4.4    Recent Labs  09/21/15 2053  LIPASE 29   CBC:  Recent Labs  09/21/15 2053  WBC 7.0  HGB 14.3  HCT 42.6  MCV 91.6  PLT 233   Cardiac Enzymes:  Recent Labs  09/22/15 0518 09/22/15 0942 09/22/15 1541  TROPONINI 0.56* 0.88* 0.71*   BNP: Invalid input(s): POCBNP D-Dimer: No results for input(s): DDIMER in the last 72 hours. Hemoglobin A1C:  Recent Labs  09/22/15 0518  HGBA1C 5.5   Fasting Lipid Panel: No results for input(s): CHOL, HDL, LDLCALC, TRIG, CHOLHDL, LDLDIRECT in the last 72 hours. Thyroid Function Tests:  Recent Labs  09/22/15 0518  TSH 4.315   Anemia Panel: No results for input(s): VITAMINB12, FOLATE, FERRITIN, TIBC, IRON, RETICCTPCT in the last 72 hours.   PHYSICAL EXAM General: Well developed, well nourished, in no acute distress HEENT:  Normocephalic and atramatic Neck:  No JVD.  Lungs: Clear bilaterally to auscultation and  percussion. Heart: HRRR . Normal S1 and S2 without gallops or murmurs.  Abdomen: Bowel sounds are positive, abdomen soft and non-tender  Msk:  Back normal, normal gait. Normal strength and tone for age. Extremities: No clubbing, cyanosis or edema.   Neuro: Alert and oriented X 3. Psych:  Good affect, responds appropriately  TELEMETRY: Normal sinus rhythm at 62 bpm  ASSESSMENT AND PLAN: Non-STEMI with max troponin of 0.88. No further chest pain. For cardiac catheterization today.  Active Problems:   NSTEMI (non-ST elevated myocardial infarction) (HCC)    Berton Bon, NP 09/23/2015 8:23 AM

## 2015-09-23 NOTE — Progress Notes (Signed)
Pt to be discharged this evening. Iv and tele removed. disch instructions givento pt to her understanding. disch via w.c. Accompanied by friend.

## 2015-09-23 NOTE — Progress Notes (Signed)
   [place] [facility] AT [facility] [address] [place], [place] [address]  September 23, 2015  Patient:  Novice Villarroel Date of Birth: 12/26/1960 Date of Visit:  09/21/2015  To Whom it May Concern:  Please excuse Akeilah Dillahunt from work from 09/21/2015 until 09/23/2015 as she was admitted to the [facility] for medical treatment and has been receiving appropriate care. She may return to work on 09/25/15, sooner if she feels she is able to return sooner than this date.      Please don't hesitate to contact me with questions or concerns by calling  [phone] and asking them to page me directly.   Marge Duncans, MD

## 2015-09-23 NOTE — Progress Notes (Signed)
Cardiac catheterization was done without complication left ventricular ejection fraction is 60%. Patient does have 50% mid RCA and distal LAD near the apex have 60% which may have been the cause of mildly elevated troponin. Advise Plavix 300 mg by mouth today and then 75 mg by mouth once a day and aspirin 81 mg by mouth daily and advise isosorbide 30 mg by mouth daily. Patient can go home with follow-up stent 10:00 on Friday.

## 2015-09-23 NOTE — Clinical Documentation Improvement (Signed)
Cardiology  Please document query responses in the progress notes and discharge summary, not on the CDI BPA form in [facility]. Thank you!  Please document if a condition below provides greater specificity regarding the patient's height, weight and BMI:  - Morbid Obesity, BMI 51.04  - Other condition  - Unable to clinically determine  Clinical Information: Patient's recorded height and weight in [facility]: Height - 5\' 6"  Weight - 316 lbs 1.6 ozs. BMI - 51.04 per H&P under Doc Review in [facility].  Please exercise your independent, professional judgment when responding. A specific answer is not anticipated or expected.   Thank You, Jerral Ralph  RN BSN CCDS [phone] Health Information Management [place]

## 2015-09-23 NOTE — Discharge Summary (Signed)
[facility] [place] [facility]   PATIENT NAME: Michelle Rivers    MR#:  [id number]  DATE OF BIRTH:  Mar 27, 1961  DATE OF ADMISSION:  09/21/2015 ADMITTING PHYSICIAN: Arnaldo Natal, MD  DATE OF DISCHARGE: 09/23/2015  PRIMARY CARE PHYSICIAN: Jerl Mina, MD    ADMISSION DIAGNOSIS:  Indigestion [R10.13] Elevated troponin [R79.89]  DISCHARGE DIAGNOSIS:  Chest pain Demand Ischemia Obesity  SECONDARY DIAGNOSIS:   Past Medical History  Diagnosis Date  . Sleep apnea   . Sciatica   . Atrial fibrillation (HCC)   . Hypoglycemia   . Renal tubular acidosis   . Renal tubular acidosis   . Hypertension   . Thyroid disease     HOSPITAL COURSE:  Michelle Rivers  is a 55 y.o. female admitted 09/21/2015 with chief complaint Chest Pain . Please see H&P performed by Arnaldo Natal, MD for further information. Patient presented with the above symptoms. Found to have elevated troponin. Concern for NSTEMI. Evaluated by cardiology and underwent cardiac cath.   Cardiac catheterization was done without complication left ventricular ejection fraction is 60%. Patient does have 50% mid RCA and distal LAD near the apex have 60% which may have been the cause of mildly elevated troponin. Advise Plavix 300 mg by mouth today and then 75 mg by mouth once a day and aspirin 81 mg by mouth daily and advise isosorbide 30 mg by mouth daily. Patient can go home with follow-up  10:00 on Friday.  DISCHARGE CONDITIONS:   stable  CONSULTS OBTAINED:  Treatment Team:  Laurier Nancy, MD  DRUG ALLERGIES:   Allergies  Allergen Reactions  . Demerol [Meperidine] Nausea And Vomiting    DISCHARGE MEDICATIONS:   Current Discharge Medication List    START taking these medications   Details  aspirin EC 81 MG tablet Take 1 tablet (81 mg total) by mouth daily. Qty: 30 tablet, Refills: 0    clopidogrel (PLAVIX) 75 MG tablet Take 1 tablet (75 mg total) by mouth daily. Qty: 30  tablet, Refills: 0    isosorbide mononitrate (IMDUR) 30 MG 24 hr tablet Take 1 tablet (30 mg total) by mouth daily. Qty: 30 tablet, Refills: 0      CONTINUE these medications which have NOT CHANGED   Details  acetaminophen (TYLENOL) 500 MG tablet Take 500 mg by mouth every 6 (six) hours as needed for mild pain, fever or headache.    levothyroxine (SYNTHROID, LEVOTHROID) 112 MCG tablet Take 112 mcg by mouth daily.         DISCHARGE INSTRUCTIONS:    DIET:  Regular diet  DISCHARGE CONDITION:  Good  ACTIVITY:  Activity as tolerated  OXYGEN:  Home Oxygen: No.   Oxygen Delivery: room air  DISCHARGE LOCATION:  home   If you experience worsening of your admission symptoms, develop shortness of breath, life threatening emergency, suicidal or homicidal thoughts you must seek medical attention immediately by calling 911 or calling your MD immediately  if symptoms less severe.  You Must read complete instructions/literature along with all the possible adverse reactions/side effects for all the Medicines you take and that have been prescribed to you. Take any new Medicines after you have completely understood and accpet all the possible adverse reactions/side effects.   Please note  You were cared for by a hospitalist during your hospital stay. If you have any questions about your discharge medications or the care you received while you were in the hospital after you are discharged,  you can call the unit and asked to speak with the hospitalist on call if the hospitalist that took care of you is not available. Once you are discharged, your primary care physician will handle any further medical issues. Please note that NO REFILLS for any discharge medications will be authorized once you are discharged, as it is imperative that you return to your primary care physician (or establish a relationship with a primary care physician if you do not have one) for your aftercare needs so that they  can reassess your need for medications and monitor your lab values.    On the day of Discharge:   VITAL SIGNS:  Blood pressure 120/64, pulse 69, temperature 98 F (36.7 C), temperature source Oral, resp. rate 72, height 5\' 6"  (1.676 m), weight 316 lb 1.6 oz (143.382 kg), SpO2 96 %.  I/O:   Intake/Output Summary (Last 24 hours) at 09/23/15 1440 Last data filed at 09/23/15 0809  Gross per 24 hour  Intake    240 ml  Output   3150 ml  Net  -2910 ml    PHYSICAL EXAMINATION:  GENERAL:  55 y.o.-year-old patient lying in the bed with no acute distress.  EYES: Pupils equal, round, reactive to light and accommodation. No scleral icterus. Extraocular muscles intact.  HEENT: Head atraumatic, normocephalic. Oropharynx and nasopharynx clear.  NECK:  Supple, no jugular venous distention. No thyroid enlargement, no tenderness.  LUNGS: Normal breath sounds bilaterally, no wheezing, rales,rhonchi or crepitation. No use of accessory muscles of respiration.  CARDIOVASCULAR: S1, S2 normal. No murmurs, rubs, or gallops.  ABDOMEN: Soft, non-tender, non-distended. Bowel sounds present. No organomegaly or mass.  EXTREMITIES: No pedal edema, cyanosis, or clubbing.  NEUROLOGIC: Cranial nerves II through XII are intact. Muscle strength 5/5 in all extremities. Sensation intact. Gait not checked.  PSYCHIATRIC: The patient is alert and oriented x 3.  SKIN: No obvious rash, lesion, or ulcer.   DATA REVIEW:   CBC  Recent Labs Lab 09/21/15 2053  WBC 7.0  HGB 14.3  HCT 42.6  PLT 233    Chemistries   Recent Labs Lab 09/21/15 2053  NA 140  K 4.2  CL 106  CO2 25  GLUCOSE 102*  BUN 15  CREATININE 0.82  CALCIUM 9.1  AST 26  ALT 27  ALKPHOS 75  BILITOT 0.3    Cardiac Enzymes  Recent Labs Lab 09/22/15 1541  TROPONINI 0.71*    Microbiology Results  No results found for this or any previous visit.  RADIOLOGY:  Dg Chest 2 View  09/21/2015  CLINICAL DATA:  Pt reports she was at the  movies when she developed "chest discomfort" and a feeling of indigestion and feeling like she needed to have a BM. Pt reports she was dx'd with afib back in April. Pt is supposed to be on Eliquis but has not started taking it yet. Pt denies n/v, shortness of breath or diaphoresis with the discomfort EXAM: CHEST  2 VIEW COMPARISON:  None. FINDINGS: The heart size and mediastinal contours are within normal limits. Both lungs are clear. No pleural effusion or pneumothorax. The visualized skeletal structures are unremarkable. IMPRESSION: No active cardiopulmonary disease. Electronically Signed   By: Amie Portland M.D.   On: 09/21/2015 21:45     Management plans discussed with the patient, family and they are in agreement.  CODE STATUS:     Code Status Orders        Start     Ordered   09/22/15  [id number]  Full code   Continuous     09/22/15 0349    Code Status History    Date Active Date Inactive Code Status Order ID Comments User Context   This patient has a current code status but no historical code status.      TOTAL TIME TAKING CARE OF THIS PATIENT: 28 minutes.    Xavier Munger,  Mardi Main[place].D on 09/23/2015 at 2:40 PM  Between 7am to 6pm - Pager - [phone]  After 6pm go to [email or web address] - Social research officer, government  [employer] [facility]  Office  [phone]  CC: Primary care physician; Jerl Mina, MD

## 2015-09-24 ENCOUNTER — Other Ambulatory Visit: Payer: Self-pay

## 2015-09-24 ENCOUNTER — Encounter: Payer: Self-pay | Admitting: Occupational Medicine

## 2015-09-24 ENCOUNTER — Observation Stay
Admission: EM | Admit: 2015-09-24 | Discharge: 2015-09-25 | Disposition: A | Discharge status: Home / Self Care | Payer: BLUE CROSS/BLUE SHIELD | Attending: Internal Medicine | Admitting: Internal Medicine

## 2015-09-24 ENCOUNTER — Emergency Department: Payer: BLUE CROSS/BLUE SHIELD

## 2015-09-24 DIAGNOSIS — I480 Paroxysmal atrial fibrillation: Secondary | ICD-10-CM | POA: Insufficient documentation

## 2015-09-24 DIAGNOSIS — R079 Chest pain, unspecified: Secondary | ICD-10-CM | POA: Diagnosis present

## 2015-09-24 DIAGNOSIS — Z9889 Other specified postprocedural states: Secondary | ICD-10-CM | POA: Insufficient documentation

## 2015-09-24 DIAGNOSIS — Z8052 Family history of malignant neoplasm of bladder: Secondary | ICD-10-CM | POA: Insufficient documentation

## 2015-09-24 DIAGNOSIS — Z79899 Other long term (current) drug therapy: Secondary | ICD-10-CM | POA: Insufficient documentation

## 2015-09-24 DIAGNOSIS — E039 Hypothyroidism, unspecified: Secondary | ICD-10-CM | POA: Insufficient documentation

## 2015-09-24 DIAGNOSIS — Z87891 Personal history of nicotine dependence: Secondary | ICD-10-CM | POA: Diagnosis not present

## 2015-09-24 DIAGNOSIS — Z800 Family history of malignant neoplasm of digestive organs: Secondary | ICD-10-CM | POA: Insufficient documentation

## 2015-09-24 DIAGNOSIS — I2510 Atherosclerotic heart disease of native coronary artery without angina pectoris: Secondary | ICD-10-CM | POA: Insufficient documentation

## 2015-09-24 DIAGNOSIS — Z7902 Long term (current) use of antithrombotics/antiplatelets: Secondary | ICD-10-CM | POA: Insufficient documentation

## 2015-09-24 DIAGNOSIS — Z885 Allergy status to narcotic agent status: Secondary | ICD-10-CM | POA: Diagnosis not present

## 2015-09-24 DIAGNOSIS — Z90721 Acquired absence of ovaries, unilateral: Secondary | ICD-10-CM | POA: Insufficient documentation

## 2015-09-24 DIAGNOSIS — Z8249 Family history of ischemic heart disease and other diseases of the circulatory system: Secondary | ICD-10-CM | POA: Insufficient documentation

## 2015-09-24 DIAGNOSIS — I200 Unstable angina: Secondary | ICD-10-CM | POA: Diagnosis present

## 2015-09-24 DIAGNOSIS — G4730 Sleep apnea, unspecified: Secondary | ICD-10-CM | POA: Diagnosis not present

## 2015-09-24 DIAGNOSIS — K449 Diaphragmatic hernia without obstruction or gangrene: Secondary | ICD-10-CM | POA: Insufficient documentation

## 2015-09-24 DIAGNOSIS — Z9114 Patient's other noncompliance with medication regimen: Secondary | ICD-10-CM | POA: Diagnosis not present

## 2015-09-24 DIAGNOSIS — Z7982 Long term (current) use of aspirin: Secondary | ICD-10-CM | POA: Insufficient documentation

## 2015-09-24 DIAGNOSIS — I213 ST elevation (STEMI) myocardial infarction of unspecified site: Secondary | ICD-10-CM | POA: Insufficient documentation

## 2015-09-24 DIAGNOSIS — R0789 Other chest pain: Principal | ICD-10-CM | POA: Insufficient documentation

## 2015-09-24 DIAGNOSIS — Z9049 Acquired absence of other specified parts of digestive tract: Secondary | ICD-10-CM | POA: Diagnosis not present

## 2015-09-24 HISTORY — DX: Acute myocardial infarction, unspecified: I21.9

## 2015-09-24 LAB — CBC WITH DIFFERENTIAL/PLATELET
BASOS ABS: 0.1 10*3/uL (ref 0–0.1)
Basophils Relative: 1 %
EOS PCT: 2 %
Eosinophils Absolute: 0.1 10*3/uL (ref 0–0.7)
HEMATOCRIT: 41.4 % (ref 35.0–47.0)
Hemoglobin: 14 g/dL (ref 12.0–16.0)
LYMPHS PCT: 21 %
Lymphs Abs: 1.6 10*3/uL (ref 1.0–3.6)
MCH: 31.4 pg (ref 26.0–34.0)
MCHC: 33.7 g/dL (ref 32.0–36.0)
MCV: 93.1 fL (ref 80.0–100.0)
MONO ABS: 0.7 10*3/uL (ref 0.2–0.9)
MONOS PCT: 9 %
NEUTROS ABS: 5.2 10*3/uL (ref 1.4–6.5)
Neutrophils Relative %: 67 %
PLATELETS: 208 10*3/uL (ref 150–440)
RBC: 4.45 MIL/uL (ref 3.80–5.20)
RDW: 13.2 % (ref 11.5–14.5)
WBC: 7.8 10*3/uL (ref 3.6–11.0)

## 2015-09-24 LAB — COMPREHENSIVE METABOLIC PANEL
ALT: 26 U/L (ref 14–54)
ANION GAP: 8 (ref 5–15)
AST: 27 U/L (ref 15–41)
Albumin: 4.3 g/dL (ref 3.5–5.0)
Alkaline Phosphatase: 82 U/L (ref 38–126)
BILIRUBIN TOTAL: 0.7 mg/dL (ref 0.3–1.2)
BUN: 18 mg/dL (ref 6–20)
CALCIUM: 9.2 mg/dL (ref 8.9–10.3)
CHLORIDE: 106 mmol/L (ref 101–111)
CO2: 26 mmol/L (ref 22–32)
CREATININE: 0.88 mg/dL (ref 0.44–1.00)
GLUCOSE: 117 mg/dL — AB (ref 65–99)
POTASSIUM: 3.9 mmol/L (ref 3.5–5.1)
Sodium: 140 mmol/L (ref 135–145)
TOTAL PROTEIN: 7.1 g/dL (ref 6.5–8.1)

## 2015-09-24 LAB — BRAIN NATRIURETIC PEPTIDE: B Natriuretic Peptide: 21 pg/mL (ref 0.0–100.0)

## 2015-09-24 LAB — GLUCOSE, CAPILLARY
GLUCOSE-CAPILLARY: 110 mg/dL — AB (ref 65–99)
GLUCOSE-CAPILLARY: 151 mg/dL — AB (ref 65–99)
GLUCOSE-CAPILLARY: 81 mg/dL (ref 65–99)
GLUCOSE-CAPILLARY: 97 mg/dL (ref 65–99)
Glucose-Capillary: 91 mg/dL (ref 65–99)
Glucose-Capillary: 96 mg/dL (ref 65–99)
Glucose-Capillary: 95 mg/dL (ref 65–99)

## 2015-09-24 LAB — TROPONIN I
TROPONIN I: 0.23 ng/mL — AB (ref <0.031)
TROPONIN I: 0.17 ng/mL — AB (ref <0.031)
Troponin I: 0.26 ng/mL — ABNORMAL HIGH (ref <0.031)
Troponin I: 0.23 ng/mL — ABNORMAL HIGH (ref <0.031)

## 2015-09-24 MED ORDER — SUCRALFATE 1 GM/10ML PO SUSP
1.0000 g | Freq: Three times a day (TID) | ORAL | Status: DC
  Administered 2015-09-24 – 2015-09-25 (×3): 1 g via ORAL
  Filled 2015-09-24 (×6): qty 10

## 2015-09-24 MED ORDER — PANTOPRAZOLE SODIUM 40 MG PO TBEC
40.0000 mg | DELAYED_RELEASE_TABLET | Freq: Every day | ORAL | Status: DC
  Administered 2015-09-24 – 2015-09-25 (×2): 40 mg via ORAL
  Filled 2015-09-24 (×2): qty 1

## 2015-09-24 MED ORDER — LEVOTHYROXINE SODIUM 112 MCG PO TABS
112.0000 ug | ORAL_TABLET | Freq: Every day | ORAL | Status: DC
  Administered 2015-09-24 – 2015-09-25 (×2): 112 ug via ORAL
  Filled 2015-09-24 (×2): qty 1

## 2015-09-24 MED ORDER — ISOSORBIDE MONONITRATE ER 60 MG PO TB24
60.0000 mg | ORAL_TABLET | Freq: Every day | ORAL | Status: DC
  Administered 2015-09-24 – 2015-09-25 (×2): 60 mg via ORAL
  Filled 2015-09-24 (×2): qty 1

## 2015-09-24 MED ORDER — DEXTROSE-NACL 5-0.9 % IV SOLN: INTRAVENOUS | Status: DC

## 2015-09-24 MED ORDER — NITROGLYCERIN 2 % TD OINT
1.0000 [in_us] | TOPICAL_OINTMENT | Freq: Once | TRANSDERMAL | Status: AC
  Administered 2015-09-24: 1 [in_us] via TOPICAL

## 2015-09-24 MED ORDER — ASPIRIN EC 81 MG PO TBEC
81.0000 mg | DELAYED_RELEASE_TABLET | Freq: Every day | ORAL | Status: DC
  Administered 2015-09-24 – 2015-09-25 (×2): 81 mg via ORAL
  Filled 2015-09-24 (×2): qty 1

## 2015-09-24 MED ORDER — ONDANSETRON HCL 4 MG/2ML IJ SOLN: 4.0000 mg | Freq: Four times a day (QID) | INTRAMUSCULAR | Status: DC | PRN

## 2015-09-24 MED ORDER — ENOXAPARIN SODIUM 150 MG/ML ~~LOC~~ SOLN
1.0000 mg/kg | Freq: Once | SUBCUTANEOUS | Status: AC
  Administered 2015-09-24: 145 mg via SUBCUTANEOUS
  Filled 2015-09-24: qty 0.98

## 2015-09-24 MED ORDER — ENOXAPARIN SODIUM 40 MG/0.4ML ~~LOC~~ SOLN
40.0000 mg | Freq: Two times a day (BID) | SUBCUTANEOUS | Status: DC
  Administered 2015-09-24 – 2015-09-25 (×2): 40 mg via SUBCUTANEOUS
  Filled 2015-09-24 (×3): qty 0.4

## 2015-09-24 MED ORDER — NITROGLYCERIN 2 % TD OINT
0.5000 [in_us] | TOPICAL_OINTMENT | Freq: Once | TRANSDERMAL | Status: DC
  Filled 2015-09-24: qty 1

## 2015-09-24 MED ORDER — GI COCKTAIL ~~LOC~~: 30.0000 mL | Freq: Four times a day (QID) | ORAL | Status: DC | PRN

## 2015-09-24 MED ORDER — MORPHINE SULFATE (PF) 2 MG/ML IV SOLN: 2.0000 mg | INTRAVENOUS | Status: DC | PRN

## 2015-09-24 MED ORDER — ACETAMINOPHEN 325 MG PO TABS
650.0000 mg | ORAL_TABLET | ORAL | Status: DC | PRN
  Administered 2015-09-24 – 2015-09-25 (×2): 650 mg via ORAL
  Filled 2015-09-24 (×2): qty 2

## 2015-09-24 MED ORDER — ACETAMINOPHEN 500 MG PO TABS
500.0000 mg | ORAL_TABLET | Freq: Four times a day (QID) | ORAL | Status: DC | PRN
  Administered 2015-09-24: 500 mg via ORAL
  Filled 2015-09-24: qty 1

## 2015-09-24 MED ORDER — SENNA 8.6 MG PO TABS
1.0000 | ORAL_TABLET | Freq: Every day | ORAL | Status: DC
  Administered 2015-09-24: 8.6 mg via ORAL
  Filled 2015-09-24: qty 1

## 2015-09-24 MED ORDER — POLYETHYLENE GLYCOL 3350 17 G PO PACK
17.0000 g | PACK | Freq: Every day | ORAL | Status: DC
  Administered 2015-09-25: 17 g via ORAL
  Filled 2015-09-24: qty 1

## 2015-09-24 MED ORDER — METOPROLOL TARTRATE 25 MG PO TABS
12.5000 mg | ORAL_TABLET | Freq: Two times a day (BID) | ORAL | Status: DC
  Administered 2015-09-24 – 2015-09-25 (×2): 12.5 mg via ORAL
  Filled 2015-09-24 (×2): qty 1

## 2015-09-24 MED ORDER — CLOPIDOGREL BISULFATE 75 MG PO TABS
75.0000 mg | ORAL_TABLET | Freq: Every day | ORAL | Status: DC
  Administered 2015-09-24: 75 mg via ORAL
  Filled 2015-09-24 (×2): qty 1

## 2015-09-24 MED ORDER — ENOXAPARIN SODIUM 40 MG/0.4ML ~~LOC~~ SOLN: 40.0000 mg | SUBCUTANEOUS | Status: DC

## 2015-09-24 MED ORDER — GLUCOSE 4 G PO CHEW: 1.0000 | CHEWABLE_TABLET | ORAL | Status: DC | PRN

## 2015-09-24 NOTE — ED Notes (Signed)
Pt presents tonight chest tightness feels like her chest is like a bra squeezing her to death. Pt states alittle SOB. Pt presents with pain in the last hours. Pt MI June 18th. MD placed Plavix and Imdur started today.

## 2015-09-24 NOTE — H&P (Signed)
[facility] [place] at [facility]   PATIENT NAME: Michelle Rivers    MR#:  [id number]  DATE OF BIRTH:  04-05-1961  DATE OF ADMISSION:  09/24/2015  PRIMARY CARE PHYSICIAN: Jerl Mina, MD   REQUESTING/REFERRING PHYSICIAN: Dr. Dolores Frame of ER  CHIEF COMPLAINT:   Chief Complaint  Patient presents with  . Chest Pain  . Shortness of Breath    HISTORY OF PRESENT ILLNESS:  Michelle Rivers  is a 55 y.o. female with a known history of HTN, Atrial fibrillation, hypothyroidism Was recently admitted to our service June 17 2/20 after patient experienced some chest tightness. Patient was seen by Dr. [facility] and she underwent a cardiac catheterization earlier today on June 20. The left ventricular ejection fraction of 60% with 50% mid RCA stenosis, his total LAD the apex at 60% stenosis. This distal apical stenosis was not to of had an increase in the troponin which peaked 0.88. Patient was started on Plavix 75 mg, aspirin 81 mg and isosorbide 30 mg extended release daily. Patient was given her medications and the discharge to home. Shortly after she left the hospital patients that she was in her kitchen fixing some crackers when she developed some very similar chest tightness which was under bilateral breasts, pressure like sensation 5/10 in severity. Patient said symptoms were similar but not quite as severe as when she initially presented on the 17th. She had some associated shortness of breath, said that she had a little difficulty speaking in full sentences, denied any lightheadedness or dizziness. Did report some palpitations. She denied any radiating pain. She did not have any symptoms of indigestion and her chest pain was not pleuritic in nature. Because of the similar to came back into the emergency room.  In the ER vitals have been stable, she has CBC and metabolic panel both were benign. She had an initial troponin earlier today that that was 0.71, troponin in  the emergency room tonight was 0.26. BNP was normal at 21. She showed normal sinus rhythm rate of 76. CXR showed mild vascular congestion. No diuretics have been given. Patient was close follow up with Dr. Romeo Apple on Friday at 10 AM. The chest pain hospital was called to readmit.  PAST MEDICAL HISTORY:   Past Medical History  Diagnosis Date  . Sleep apnea   . Sciatica   . Atrial fibrillation (HCC)   . Hypoglycemia   . Renal tubular acidosis   . Renal tubular acidosis   . Hypertension   . Thyroid disease   . Acute MI [employer]) June 18th    PAST SURGICAL HISTORY:   Past Surgical History  Procedure Laterality Date  . Cholecystectomy    . Right oophorectomy    . Dilation and curettage of uterus    . Appendectomy    . Fracture surgery    . Hand surgery    . Plantar fascia release    . Cardiac catheterization Right 09/23/2015    Procedure: Left Heart Cath and Coronary Angiography;  Surgeon: Laurier Nancy, MD;  Location: [facility] INVASIVE CV LAB;  Service: Cardiovascular;  Laterality: Right;    SOCIAL HISTORY:   Social History  Substance Use Topics  . Smoking status: Former Games developer  . Smokeless tobacco: Not on file  . Alcohol Use: No    FAMILY HISTORY:   Family History  Problem Relation Age of Onset  . CAD Mother   . CAD Father   . Liver cancer Brother  DRUG ALLERGIES:   Allergies  Allergen Reactions  . Demerol [Meperidine] Nausea And Vomiting    REVIEW OF SYSTEMS:   ROS  Review of Systems:  Constitutional: No weight loss, night sweats, Fevers, chills, fatigue.  HEENT:  No headaches, Difficulty swallowing,Tooth/dental problems,Sore throat, Cardio-vascular: Chest pain per HPI, denied Orthopnea, PND, patient reported mild swelling in lower extremities, denied anasarca, dizziness, palpitations  GI: No heartburn, indigestion, abdominal pain, nausea, vomiting, diarrhea, change in bowel habits, loss of appetite  Resp:  Shortness of breath as in HPI. No excess mucus, no  productive cough, No non-productive cough, No coughing up of blood.No change in color of mucus.No wheezing.No chest wall deformity  Skin:  no rash or lesions.  GU:  no dysuria, change in color of urine, no urgency or frequency. No flank pain.  Musculoskeletal:   No joint pain or swelling. No decreased range of motion. No back pain.  Psych:  No change in mood or affect. No depression or anxiety. No memory loss.  Neuro:  No change in sensation, unilateral strength, or cognitive abilities    MEDICATIONS AT HOME:   Prior to Admission medications   Medication Sig Start Date End Date Taking? Authorizing Provider  acetaminophen (TYLENOL) 500 MG tablet Take 500 mg by mouth every 6 (six) hours as needed for mild pain, fever or headache.    Historical Provider, MD  aspirin EC 81 MG tablet Take 1 tablet (81 mg total) by mouth daily. 09/23/15   Wyatt Haste, MD  clopidogrel (PLAVIX) 75 MG tablet Take 1 tablet (75 mg total) by mouth daily. 09/23/15   Wyatt Haste, MD  isosorbide mononitrate (IMDUR) 30 MG 24 hr tablet Take 1 tablet (30 mg total) by mouth daily. 09/23/15   Wyatt Haste, MD  levothyroxine (SYNTHROID, LEVOTHROID) 112 MCG tablet Take 112 mcg by mouth daily.    Historical Provider, MD      VITAL SIGNS:  Blood pressure 119/58, pulse 72, temperature 97.8 F (36.6 C), temperature source Oral, resp. rate 20, height  (1.676 m), weight 146.965 kg (324 lb), SpO2 95 %.  PHYSICAL EXAMINATION:  Physical Exam  GENERAL:  56 y.o.-year-old patient lying in the bed with no acute distress.  EYES: Pupils equal, round, reactive to light and accommodation. No scleral icterus. Extraocular muscles intact.  HEENT: Head atraumatic, normocephalic. Oropharynx and nasopharynx clear.  NECK:  Supple, no jugular venous distention. No thyroid enlargement, no tenderness.  LUNGS: Normal breath sounds bilaterally, no wheezing, rales,rhonchi or crepitation. No use of accessory muscles of respiration.    CARDIOVASCULAR: S1, S2 normal. No murmurs, rubs, or gallops.  ABDOMEN: Soft, nontender, nondistended. Bowel sounds present. No organomegaly or mass.  EXTREMITIES: No pedal edema, cyanosis, or clubbing.  NEUROLOGIC: Cranial nerves II through XII are intact. Muscle strength 5/5 in all extremities. Sensation intact. Gait not checked.  PSYCHIATRIC: The patient is alert and oriented x 3.  SKIN: No obvious rash, lesion, or ulcer.   LABORATORY PANEL:   CBC  Recent Labs Lab 09/24/15 0056  WBC 7.8  HGB 14.0  HCT 41.4  PLT 208   ------------------------------------------------------------------------------------------------------------------  Chemistries   Recent Labs Lab 09/24/15 0056  NA 140  K 3.9  CL 106  CO2 26  GLUCOSE 117*  BUN 18  CREATININE 0.88  CALCIUM 9.2  AST 27  ALT 26  ALKPHOS 82  BILITOT 0.7   ------------------------------------------------------------------------------------------------------------------  Cardiac Enzymes  Recent Labs Lab 09/24/15 0056  TROPONINI 0.26*   ------------------------------------------------------------------------------------------------------------------  RADIOLOGY:  Dg Chest Port 1 View  09/24/2015  CLINICAL DATA:  Acute onset of generalized chest tightness and mild shortness of breath. Initial encounter. EXAM: PORTABLE CHEST 1 VIEW COMPARISON:  Chest radiograph performed 09/21/2015 FINDINGS: The lungs are well-aerated. Mild vascular congestion is noted. There is no evidence of focal opacification, pleural effusion or pneumothorax. The cardiomediastinal silhouette is within normal limits. No acute osseous abnormalities are seen. IMPRESSION: Mild vascular congestion noted.  Lungs remain grossly clear. Electronically Signed   By: Roanna Raider M.D.   On: 09/24/2015 01:24      IMPRESSION AND PLAN:   23 female admitted to observation for chest pain  1. Unstable angina. Patient had cath yesterday morning which showed  left ventricle ejection fraction of 60%, 50% mid RCA stenosis, distal LAD in the apex had 60% stenosis. Patient started on Plavix 75 mg daily, aspirin 81 mg daily and isosorbide 30 mg extended release. She was given all his medications before leaving the hospital. She had similar symptoms this evening which brought her back into the hospital. Her troponin was continued to decline initially 0.71 it's come back down to 0.26. Monitor on telemetry, was likely enzymes continue to ensure that they're decreasing. Reconsult cardiology for further recommendations. Increased Imdur to 60 mg daily. She was given a topical Nitropaste in the emergency room and currently his chest pain-free. Continue patient's Plavix 75 mg daily and aspirin 81 mg. Patient started on metoprolol tartrate 1.5 mg twice a day.  2. Atrial fibrillation. Patient reportedly was was beyond Xarelto but states that she's had some menstrual bleeding. She has been following with OB and has had a biopsy recently. Patient was just started on Plavix 75 mg daily aspirin 81 mg daily. Her CHADS2Vasc score = 3. EKG was in NSR. Monitor on telemetry. Discuss with cardiology about continued dual antiplatelet therapy vs full anticoagulation.   3. Hypothyroidism. Continue home dose of Synthroid 112 g daily. TSH was within normal reference range of 4.315 on June 18.  4.DVT prophylaxis. Patient given a full dose of Lovenox in ER.        All the records are reviewed and case discussed with ED provider. Management plans discussed with the patient, family and they are in agreement.  CODE STATUS: Full  TOTAL TIME TAKING CARE OF THIS PATIENT: 40 minutes.    Joella Prince M.D on 09/24/2015 at 4:20 AM  Between 7am to 6pm - Pager - [phone]  After 6pm go to [email or web address] - Social research officer, government  [facility] [place] [facility]  Office  [phone]  CC: Primary care physician; Jerl Mina, MD   Note: This dictation was prepared with Dragon  dictation along with smaller phrase technology. Any transcriptional errors that result from this process are unintentional.

## 2015-09-24 NOTE — Progress Notes (Signed)
Spoke with dr. Welton Flakes NP. Per Jeraldine Loots np patient is okay to have heart healthy/carb mod. diet

## 2015-09-24 NOTE — Progress Notes (Signed)
[facility] - [place] at [facility]   PATIENT NAME: Michelle Rivers    MRN#:  [id number]  DATE OF BIRTH:  04-Jul-1960  SUBJECTIVE:  Hospital Day: none Michelle Rivers is a 55 y.o. female presenting with Chest Pain and Shortness of Breath .   Overnight events: No overnight events Interval Events: Still complains of intermittent chest pain  REVIEW OF SYSTEMS:  CONSTITUTIONAL: No fever, fatigue or weakness.  EYES: No blurred or double vision.  EARS, NOSE, AND THROAT: No tinnitus or ear pain.  RESPIRATORY: No cough, shortness of breath, wheezing or hemoptysis.  CARDIOVASCULAR: No chest pain, orthopnea, edema.  GASTROINTESTINAL: No nausea, vomiting, diarrhea or abdominal pain.  GENITOURINARY: No dysuria, hematuria.  ENDOCRINE: No polyuria, nocturia,  HEMATOLOGY: No anemia, easy bruising or bleeding SKIN: No rash or lesion. MUSCULOSKELETAL: No joint pain or arthritis.   NEUROLOGIC: No tingling, numbness, weakness.  PSYCHIATRY: No anxiety or depression.   DRUG ALLERGIES:   Allergies  Allergen Reactions  . Demerol [Meperidine] Nausea And Vomiting    VITALS:  Blood pressure 107/49, pulse 63, temperature 97.6 F (36.4 C), temperature source Oral, resp. rate 20, height 5\' 6"  (1.676 m), weight 314 lb 6.4 oz (142.611 kg), SpO2 96 %.  PHYSICAL EXAMINATION:  VITAL SIGNS: Filed Vitals:   09/24/15 1130 09/24/15 1147  BP: 95/54 107/49  Pulse: 63   Temp: 97.6 F (36.4 C)   Resp: 20    GENERAL:54 y.o.female currently in no acute distress.  HEAD: Normocephalic, atraumatic.  EYES: Pupils equal, round, reactive to light. Extraocular muscles intact. No scleral icterus.  MOUTH: Moist mucosal membrane. Dentition intact. No abscess noted.  EAR, NOSE, THROAT: Clear without exudates. No external lesions.  NECK: Supple. No thyromegaly. No nodules. No JVD.  PULMONARY: Clear to ascultation, without wheeze rails or rhonci. No use of accessory muscles, Good respiratory  effort. good air entry bilaterally CHEST: Nontender to palpation.  CARDIOVASCULAR: S1 and S2. Regular rate and rhythm. No murmurs, rubs, or gallops. No edema. Pedal pulses 2+ bilaterally.  GASTROINTESTINAL: Soft, nontender, nondistended. No masses. Positive bowel sounds. No hepatosplenomegaly.  MUSCULOSKELETAL: No swelling, clubbing, or edema. Range of motion full in all extremities.  NEUROLOGIC: Cranial nerves II through XII are intact. No gross focal neurological deficits. Sensation intact. Reflexes intact.  SKIN: No ulceration, lesions, rashes, or cyanosis. Skin warm and dry. Turgor intact.  PSYCHIATRIC: Mood, affect within normal limits. The patient is awake, alert and oriented x 3. Insight, judgment intact.      LABORATORY PANEL:   CBC  Recent Labs Lab 09/24/15 0056  WBC 7.8  HGB 14.0  HCT 41.4  PLT 208   ------------------------------------------------------------------------------------------------------------------  Chemistries   Recent Labs Lab 09/24/15 0056  NA 140  K 3.9  CL 106  CO2 26  GLUCOSE 117*  BUN 18  CREATININE 0.88  CALCIUM 9.2  AST 27  ALT 26  ALKPHOS 82  BILITOT 0.7   ------------------------------------------------------------------------------------------------------------------  Cardiac Enzymes  Recent Labs Lab 09/24/15 1422  TROPONINI 0.17*   ------------------------------------------------------------------------------------------------------------------  RADIOLOGY:  Dg Chest Port 1 View  09/24/2015  CLINICAL DATA:  Acute onset of generalized chest tightness and mild shortness of breath. Initial encounter. EXAM: PORTABLE CHEST 1 VIEW COMPARISON:  Chest radiograph performed 09/21/2015 FINDINGS: The lungs are well-aerated. Mild vascular congestion is noted. There is no evidence of focal opacification, pleural effusion or pneumothorax. The cardiomediastinal silhouette is within normal limits. No acute osseous abnormalities are seen.  IMPRESSION: Mild vascular congestion noted.  Lungs  remain grossly clear. Electronically Signed   By: Roanna Raider M.D.   On: 09/24/2015 01:24    EKG:   Orders placed or performed during the hospital encounter of 09/24/15  . ED EKG within 10 minutes  . ED EKG within 10 minutes  . EKG 12-lead    ASSESSMENT AND PLAN:   Michelle Rivers is a 55 y.o. female presenting with Chest Pain and Shortness of Breath . Admitted 09/24/2015 : Day #: none 1 chest pain, likely noncardiac given recent evaluation. Most likely related to GI issues gastritis started on PPI therapy and Carafate 2. Paroxysmal Atrial fibrillation: No medications 3. Hypothyroidism unspecified Synthroid  All the records are reviewed and case discussed with Care Management/Social Workerr. Management plans discussed with the patient, family and they are in agreement.  CODE STATUS: full TOTAL TIME TAKING CARE OF THIS PATIENT: 28 minutes.   POSSIBLE D/C IN 1DAYS, DEPENDING ON CLINICAL CONDITION.   Michelle Rivers,  Mardi Mainland.D on 09/24/2015 at 3:32 PM  Between 7am to 6pm - Pager - [phone]  After 6pm: House Pager: - [phone]  [facility]  Office  [phone]  CC: Primary care physician; Jerl Mina, MD

## 2015-09-24 NOTE — Progress Notes (Addendum)
SUBJECTIVE: Ms Sielski was recently admitted for chest pain and had troponin of 0.88. He chest pain was low chest feeling like a tight band all the way around. Her cath did not show any significant occlusions to cause the pain. She has a history of hiatal hernia. She returned to the ED with recurrent chest pain in the same location. Current cardiac enzymes show troponins of 0.23.  Cardiac cath 09/23/2015: left ventricular ejection fraction is 60%. Patient does have 50% mid RCA and distal LAD near the apex have 60% which may have been the cause of mildly elevated troponin. Advise Plavix 300 mg by mouth today and then 75 mg by mouth once a day and aspirin 81 mg by mouth daily and advise isosorbide 30 mg by mouth daily  Filed Vitals:   09/24/15 0530 09/24/15 0531 09/24/15 0605 09/24/15 0607  BP: 123/59 123/59  123/56  Pulse:  61  72  Temp:      TempSrc:      Resp:  18  18  Height:      Weight:   314 lb 6.4 oz (142.611 kg)   SpO2:  94%  98%    Intake/Output Summary (Last 24 hours) at 09/24/15 0814 Last data filed at 09/24/15 0751  Gross per 24 hour  Intake      0 ml  Output      0 ml  Net      0 ml    LABS: Basic Metabolic Panel:  Recent Labs  59/45/85 2053 09/24/15 0056  NA 140 140  K 4.2 3.9  CL 106 106  CO2 25 26  GLUCOSE 102* 117*  BUN 15 18  CREATININE 0.82 0.88  CALCIUM 9.1 9.2   Liver Function Tests:  Recent Labs  09/21/15 2053 09/24/15 0056  AST 26 27  ALT 27 26  ALKPHOS 75 82  BILITOT 0.3 0.7  PROT 7.3 7.1  ALBUMIN 4.4 4.3    Recent Labs  09/21/15 2053  LIPASE 29   CBC:  Recent Labs  09/21/15 2053 09/24/15 0056  WBC 7.0 7.8  NEUTROABS  --  5.2  HGB 14.3 14.0  HCT 42.6 41.4  MCV 91.6 93.1  PLT 233 208   Cardiac Enzymes:  Recent Labs  09/22/15 1541 09/24/15 0056 09/24/15 0445  TROPONINI 0.71* 0.26* 0.23*   BNP: Invalid input(s): POCBNP D-Dimer: No results for input(s): DDIMER in the last 72 hours. Hemoglobin A1C:  Recent  Labs  09/22/15 0518  HGBA1C 5.5   Fasting Lipid Panel: No results for input(s): CHOL, HDL, LDLCALC, TRIG, CHOLHDL, LDLDIRECT in the last 72 hours. Thyroid Function Tests:  Recent Labs  09/22/15 0518  TSH 4.315   Anemia Panel: No results for input(s): VITAMINB12, FOLATE, FERRITIN, TIBC, IRON, RETICCTPCT in the last 72 hours.   PHYSICAL EXAM General: Well developed, well nourished, in no acute distress HEENT:  Normocephalic and atramatic Neck:  No JVD.  Lungs: Clear bilaterally to auscultation and percussion. Heart: HRRR . Normal S1 and S2 without gallops or murmurs.  Abdomen: Bowel sounds are positive, abdomen soft and non-tender  Msk:  Back normal, normal gait. Normal strength and tone for age. Extremities: No clubbing, cyanosis or edema.   Neuro: Alert and oriented X 3. Psych:  Good affect, responds appropriately  TELEMETRY: Normal sinus rhythm 70 bpm  ASSESSMENT AND PLAN: The patient is here with recurrent lower chest pain that is bandlike all the way around and similar to what she presented with 2 days ago.  At her previous admission she was found to have mildly elevated troponin and cardiac cath was completed and found nonobstructing disease with 50% mid RCA and 60% distal LAD disease, EF was 60%. She was sent home on aspirin Plavix and isosorbide.  Chest pain is more likely GI related as patient does have hiatal hernia. Advise starting Protonix 40 mg twice daily and Carafate 3 times a day.  Active Problems:   Chest pain    Berton Bon, NP 09/24/2015 8:14 AM

## 2015-09-24 NOTE — ED Provider Notes (Signed)
[facility] Emergency Department Provider Note   ____________________________________________  Time seen: Approximately 1:03 AM  I have reviewed the triage vital signs and the nursing notes.   HISTORY  Chief Complaint Chest Pain and Shortness of Breath    HPI Michelle Rivers is a 55 y.o. female who presents to the ED from home via EMS with a chief complaint of chest pain. Patient was discharged last evening after an admission for non-ST elevation MI. Her peak troponin was 0.88. She had a cardiac catheter which revealed:left ventricular ejection fraction is 60%. Patient does have 50% mid RCA and distal LAD near the apex have 60% which may have been the cause of mildly elevated troponin. Advise Plavix 300 mg by mouth today and then 75 mg by mouth once a day and aspirin 81 mg by mouth daily and advise isosorbide 30 mg by mouth daily.  Patient reports she was visiting was family members approximately 51 PM when she felt and sensation of chest tightness like her bra with squeezing her too tightly. She states she was not wearing a bra at the time and states these are the exact symptoms which warranted her recent hospitalization. The only difference is tonight she experienced some shortness of breath along with the sensation of chest tightness; states shortness of breath is new for her. Denies associated diaphoresis, nausea, vomiting, palpitations or dizziness. Denies fever, chills, cough, abdominal pain, diarrhea. Nothing makes her symptoms better or worse. Received 324 mg aspirin per EMS prior to arrival. Her pain is nonradiating, waxing/waning. Currently she is having almost no pain. Denies recent travel or trauma.   Past Medical History  Diagnosis Date  . Sleep apnea   . Sciatica   . Atrial fibrillation (HCC)   . Hypoglycemia   . Renal tubular acidosis   . Renal tubular acidosis   . Hypertension   . Thyroid disease   . Acute MI [facility]) June 18th    Patient  Active Problem List   Diagnosis Date Noted  . NSTEMI (non-ST elevated myocardial infarction) (HCC) 09/22/2015    Past Surgical History  Procedure Laterality Date  . Cholecystectomy    . Right oophorectomy    . Dilation and curettage of uterus    . Appendectomy    . Fracture surgery    . Hand surgery    . Plantar fascia release    . Cardiac catheterization Right 09/23/2015    Procedure: Left Heart Cath and Coronary Angiography;  Surgeon: Laurier Nancy, MD;  Location: [facility] INVASIVE CV LAB;  Service: Cardiovascular;  Laterality: Right;    Current Outpatient Rx  Name  Route  Sig  Dispense  Refill  . acetaminophen (TYLENOL) 500 MG tablet   Oral   Take 500 mg by mouth every 6 (six) hours as needed for mild pain, fever or headache.         Marland Kitchen aspirin EC 81 MG tablet   Oral   Take 1 tablet (81 mg total) by mouth daily.   30 tablet   0   . clopidogrel (PLAVIX) 75 MG tablet   Oral   Take 1 tablet (75 mg total) by mouth daily.   30 tablet   0   . isosorbide mononitrate (IMDUR) 30 MG 24 hr tablet   Oral   Take 1 tablet (30 mg total) by mouth daily.   30 tablet   0   . levothyroxine (SYNTHROID, LEVOTHROID) 112 MCG tablet   Oral   Take 112  mcg by mouth daily.           Allergies Demerol  Family History  Problem Relation Age of Onset  . CAD Mother   . CAD Father   . Liver cancer Brother     Social History Social History  Substance Use Topics  . Smoking status: Former Games developer  . Smokeless tobacco: None  . Alcohol Use: No    Review of Systems  Constitutional: No fever/chills. Eyes: No visual changes. ENT: No sore throat. Cardiovascular: Positive for chest pain. Respiratory: Positive for shortness of breath. Gastrointestinal: No abdominal pain.  No nausea, no vomiting.  No diarrhea.  No constipation. Genitourinary: Negative for dysuria. Musculoskeletal: Negative for back pain. Skin: Negative for rash. Neurological: Negative for headaches, focal weakness or  numbness.  10-point ROS otherwise negative.  ____________________________________________   PHYSICAL EXAM:  VITAL SIGNS: ED Triage Vitals  Enc Vitals Group     BP 09/24/15 0050 143/46 mmHg     Pulse Rate 09/24/15 0050 76     Resp 09/24/15 0050 19     Temp 09/24/15 0050 97.8 F (36.6 C)     Temp Source 09/24/15 0050 Oral     SpO2 09/24/15 0045 100 %     Weight 09/24/15 0050 324 lb (146.965 kg)     Height 09/24/15 0050 5\' 6"  (1.676 m)     Head Cir --      Peak Flow --      Pain Score 09/24/15 0052 3     Pain Loc --      Pain Edu? --      Excl. in GC? --     Constitutional: Alert and oriented. Well appearing and in no acute distress. Eyes: Conjunctivae are normal. PERRL. EOMI. Head: Atraumatic. Nose: No congestion/rhinnorhea. Mouth/Throat: Mucous membranes are moist.  Oropharynx non-erythematous. Neck: No stridor.   Cardiovascular: Normal rate, regular rhythm. Grossly normal heart sounds.  Good peripheral circulation. Respiratory: Normal respiratory effort.  No retractions. Lungs CTAB. Gastrointestinal: Obese. Soft and nontender. No distention. No abdominal bruits. No CVA tenderness. Musculoskeletal: Right groin catheter site clean/dry/intact without evidence of warmth/erythema or palpable mass suggestive of pseudoaneurysm. No lower extremity tenderness nor edema.  No joint effusions. Neurologic:  Normal speech and language. No gross focal neurologic deficits are appreciated.  Skin:  Skin is warm, dry and intact. No rash noted. Psychiatric: Mood and affect are normal. Speech and behavior are normal.  ____________________________________________   LABS (all labs ordered are listed, but only abnormal results are displayed)  Labs Reviewed  GLUCOSE, CAPILLARY - Abnormal; Notable for the following:    Glucose-Capillary 110 (*)    All other components within normal limits  COMPREHENSIVE METABOLIC PANEL - Abnormal; Notable for the following:    Glucose, Bld 117 (*)    All  other components within normal limits  TROPONIN I - Abnormal; Notable for the following:    Troponin I 0.26 (*)    All other components within normal limits  CBC WITH DIFFERENTIAL/PLATELET  BRAIN NATRIURETIC PEPTIDE  CBC   ____________________________________________  EKG  ED ECG REPORT I, SUNG,JADE J, the attending physician, personally viewed and interpreted this ECG.   Date: 09/24/2015  EKG Time: 0048  Rate: 76  Rhythm: normal EKG, normal sinus rhythm  Axis: Normal  Intervals:none  ST&T Change: Nonspecific No significant change from 09/22/2015 ____________________________________________  RADIOLOGY  Portable chest x-ray (viewed by me, interpreted per Dr. Cherly Hensen):  Mild vascular congestion noted. Lungs remain grossly clear. ____________________________________________  PROCEDURES  Procedure(s) performed: None  Critical Care performed: No  ____________________________________________   INITIAL IMPRESSION / ASSESSMENT AND PLAN / ED COURSE  Pertinent labs & imaging results that were available during my care of the patient were reviewed by me and considered in my medical decision making (see chart for details).  55 year old female who returns with symptoms concerning for unstable angina. Aspirin administered per EMS. Will apply nitroglycerin paste, obtain troponin, chest x-ray. Anticipate hospital remission.  ----------------------------------------- 3:37 AM on 09/24/2015 -----------------------------------------  Troponin 0.26; patient pain-free after nitroglycerin paste. Will add lovenox; discuss with hospitalist to evaluate patient in the emergency department for admission. ____________________________________________   FINAL CLINICAL IMPRESSION(S) / ED DIAGNOSES  Final diagnoses:  Unstable angina (HCC)  Chest pain, unspecified chest pain type      NEW MEDICATIONS STARTED DURING THIS VISIT:  New Prescriptions   No medications on file     Note:   This document was prepared using Dragon voice recognition software and may include unintentional dictation errors.    Irean Hong, MD 09/24/15 [phone]

## 2015-09-24 NOTE — Care Management (Signed)
Discharge 6/19 pm after observation stay for chest pain and clean cath.  Presented back with chest pain.  Anticipate discharge today.  Symptoms thought to be gastrointestinal

## 2015-09-25 LAB — GLUCOSE, CAPILLARY
GLUCOSE-CAPILLARY: 114 mg/dL — AB (ref 65–99)
GLUCOSE-CAPILLARY: 126 mg/dL — AB (ref 65–99)
GLUCOSE-CAPILLARY: 99 mg/dL (ref 65–99)
Glucose-Capillary: 99 mg/dL (ref 65–99)

## 2015-09-25 MED ORDER — METOPROLOL TARTRATE 25 MG PO TABS: 12.5000 mg | ORAL_TABLET | Freq: Two times a day (BID) | ORAL | Status: DC

## 2015-09-25 MED ORDER — APIXABAN 5 MG PO TABS
5.0000 mg | ORAL_TABLET | Freq: Two times a day (BID) | ORAL | Status: DC
Start: 2015-09-25 — End: 2015-09-25
  Administered 2015-09-25: 5 mg via ORAL
  Filled 2015-09-25: qty 1

## 2015-09-25 MED ORDER — ATORVASTATIN CALCIUM 20 MG PO TABS: 20.0000 mg | ORAL_TABLET | Freq: Every day | ORAL | Status: AC

## 2015-09-25 MED ORDER — SUCRALFATE 1 GM/10ML PO SUSP: 1.0000 g | Freq: Three times a day (TID) | ORAL | Status: AC

## 2015-09-25 MED ORDER — APIXABAN 5 MG PO TABS: 5.0000 mg | ORAL_TABLET | Freq: Two times a day (BID) | ORAL | Status: AC

## 2015-09-25 MED ORDER — PANTOPRAZOLE SODIUM 40 MG PO TBEC: 40.0000 mg | DELAYED_RELEASE_TABLET | Freq: Two times a day (BID) | ORAL | Status: AC

## 2015-09-25 NOTE — Progress Notes (Signed)
Patient d/c'd home. Education provided, no questions at this time. Patient picked up by family. Telemetry removed. Ayeshia Coppin R [place]   

## 2015-09-25 NOTE — Progress Notes (Signed)
Brief Nutrition Note:  RD consulted for education regarding Heart Healthy and Diabetic nutrition therapy. Pt discharged home prior to RD rounds. RD mailed education materials to pt home address listed in chart.   Leda Quail, RD, LDN Pager [phone] Weekend/On-Call Pager [phone]

## 2015-09-25 NOTE — Progress Notes (Signed)
SUBJECTIVE: The patient is awake and alert, sitting up in chair. She is feeling better and thinks that the Carafate and Protonix is helping her lower chest pain. She reports that she had atrial fibrillation in April and was seen by cardiologist at [facility]. Holter monitor confirms the atrial fibrillation and the patient was placed on Cartia and Eliquis. However, she never began taking these medications. She says that she has been very noncompliant with medications in the past, but that she is now convinced that she should follow medical advice.   Filed Vitals:   09/24/15 1147 09/24/15 2047 09/25/15 0358 09/25/15 0400  BP: 107/49 105/47  123/56  Pulse:  57  60  Temp:  98.3 F (36.8 C)  97.9 F (36.6 C)  TempSrc:  Oral    Resp:  16  18  Height:      Weight:   320 lb 11.2 oz (145.469 kg)   SpO2:  96%  97%    Intake/Output Summary (Last 24 hours) at 09/25/15 0807 Last data filed at 09/25/15 0634  Gross per 24 hour  Intake    240 ml  Output   1700 ml  Net  -1460 ml    LABS: Basic Metabolic Panel:  Recent Labs  16/10/96 0056  NA 140  K 3.9  CL 106  CO2 26  GLUCOSE 117*  BUN 18  CREATININE 0.88  CALCIUM 9.2   Liver Function Tests:  Recent Labs  09/24/15 0056  AST 27  ALT 26  ALKPHOS 82  BILITOT 0.7  PROT 7.1  ALBUMIN 4.3   No results for input(s): LIPASE, AMYLASE in the last 72 hours. CBC:  Recent Labs  09/24/15 0056  WBC 7.8  NEUTROABS 5.2  HGB 14.0  HCT 41.4  MCV 93.1  PLT 208   Cardiac Enzymes:  Recent Labs  09/24/15 0445 09/24/15 0816 09/24/15 1422  TROPONINI 0.23* 0.23* 0.17*   BNP: Invalid input(s): POCBNP D-Dimer: No results for input(s): DDIMER in the last 72 hours. Hemoglobin A1C: No results for input(s): HGBA1C in the last 72 hours. Fasting Lipid Panel: No results for input(s): CHOL, HDL, LDLCALC, TRIG, CHOLHDL, LDLDIRECT in the last 72 hours. Thyroid Function Tests: No results for input(s): TSH, T4TOTAL, T3FREE, THYROIDAB in the  last 72 hours.  Invalid input(s): FREET3 Anemia Panel: No results for input(s): VITAMINB12, FOLATE, FERRITIN, TIBC, IRON, RETICCTPCT in the last 72 hours.   PHYSICAL EXAM General: Well developed, well nourished, in no acute distress HEENT:  Normocephalic and atramatic Neck:  No JVD.  Lungs: Clear bilaterally to auscultation and percussion. Heart: HRRR . Normal S1 and S2 without gallops or murmurs.  Abdomen: Bowel sounds are positive, abdomen soft and non-tender  Msk:  Back normal, normal gait. Normal strength and tone for age. Extremities: No clubbing, cyanosis or edema.   Neuro: Alert and oriented X 3. Psych:  Good affect, responds appropriately  TELEMETRY: Sinus rhythm 64 bpm  ASSESSMENT AND PLAN: Recurrent lower bandlike chest pain that is likely GI related and has improved on Protonix and Carafate. Nonobstructive coronary artery disease as noted on cath with 50% mid RCA and 60% distal LAD disease, EF 60%. The patient was started on aspirin and Plavix, however, she apparently has a history of atrial fibrillation of April this year and was supposed to be taking Eliquis and [place]. Discussed with patient risk of stroke related to paroxysmal atrial fibrillation and she agrees to be compliant with anticoagulation. Advise discontinuing Plavix and starting Eliquis, the patient has  unused doses at home. She should go home on her low dose beta blocker, isosorbide, statin, Protonix and Carafate. Okay to discharge today and she has follow-up in the office for Friday.  Active Problems:   Chest pain    Berton Bon, NP 09/25/2015 8:07 AM

## 2015-09-25 NOTE — Progress Notes (Signed)
Patient request to take first dose of eliquis now, per Dr. Clint Guy okay to place order for patient to take this am. Trudee Kuster

## 2015-09-25 NOTE — Care Management (Signed)
patient is to discharge home on Eliquis.  Provided coupons and patient stated she was started on Eliquis and used her 30 day coupon but never took the medication.  Says she also has copay assist coupon. Goes on to say that she will be going on medicare soon.  Informed patient that the copay assist coupon will not be valid once she has medicare as a payor source.

## 2015-09-25 NOTE — Discharge Summary (Addendum)
[facility] -  at [facility]   PATIENT NAME: Michelle Rivers    MR#:  [id number]  DATE OF BIRTH:  [birth year]  DATE OF ADMISSION:  09/24/2015 ADMITTING PHYSICIAN: Joella Prince, MD  DATE OF DISCHARGE: 09/25/2015  PRIMARY CARE PHYSICIAN: Jerl Mina, MD    ADMISSION DIAGNOSIS:   Chest pain, unspecified chest pain type [R07.9]  DISCHARGE DIAGNOSIS:  Non cardiac chest pain  SECONDARY DIAGNOSIS:   Past Medical History  Diagnosis Date  . Sleep apnea   . Sciatica   . Atrial fibrillation (HCC)   . Hypoglycemia   . Renal tubular acidosis   . Renal tubular acidosis   . Hypertension   . Thyroid disease   . Acute MI [facility]) June 18th    HOSPITAL COURSE:  Michelle Rivers  is a 55 y.o. female admitted 09/24/2015 with chief complaint Chest Pain and Shortness of Breath . Please see H&P performed by Joella Prince, MD for further information. Patient presented with the above symptoms. Of note recently discharged with similar complaints, on last admission underwent cardiac catheterization - no acute lesions. She was once again evaluated by cardiology who agreed symptoms more consistent with GI pathology. She does carry a history of hiatal hernia, given instructions on certain food avoidance and medications to hopefully prevent further symptoms. Chest pain free for remainder of hospitalization.  DISCHARGE CONDITIONS:   stable  CONSULTS OBTAINED:  Treatment Team:  Laurier Nancy, MD  DRUG ALLERGIES:   Allergies  Allergen Reactions  . Demerol [Meperidine] Nausea And Vomiting    DISCHARGE MEDICATIONS:   Current Discharge Medication List    START taking these medications   Details  apixaban (ELIQUIS) 5 MG TABS tablet Take 1 tablet (5 mg total) by mouth 2 (two) times daily. Qty: 60 tablet, Refills: 0    atorvastatin (LIPITOR) 20 MG tablet Take 1 tablet (20 mg total) by mouth daily. Qty: 30 tablet, Refills: 0    metoprolol tartrate (LOPRESSOR) 25 MG  tablet Take 0.5 tablets (12.5 mg total) by mouth 2 (two) times daily. Qty: 60 tablet, Refills: 0    pantoprazole (PROTONIX) 40 MG tablet Take 1 tablet (40 mg total) by mouth 2 (two) times daily. Qty: 60 tablet, Refills: 0    sucralfate (CARAFATE) 1 GM/10ML suspension Take 10 mLs (1 g total) by mouth 4 (four) times daily -  with meals and at bedtime. Qty: 420 mL, Refills: 0      CONTINUE these medications which have NOT CHANGED   Details  acetaminophen (TYLENOL) 500 MG tablet Take 500 mg by mouth every 6 (six) hours as needed for mild pain, fever or headache.    aspirin EC 81 MG tablet Take 1 tablet (81 mg total) by mouth daily. Qty: 30 tablet, Refills: 0    isosorbide mononitrate (IMDUR) 30 MG 24 hr tablet Take 1 tablet (30 mg total) by mouth daily. Qty: 30 tablet, Refills: 0    levothyroxine (SYNTHROID, LEVOTHROID) 112 MCG tablet Take 112 mcg by mouth daily.      STOP taking these medications     clopidogrel (PLAVIX) 75 MG tablet          DISCHARGE INSTRUCTIONS:    DIET:  Regular diet avoid spicy foods, avoid eating 30-60 minutes prior to bed time   DISCHARGE CONDITION:  Stable  ACTIVITY:  Activity as tolerated  OXYGEN:  Home Oxygen: No.   Oxygen Delivery: room air  DISCHARGE LOCATION:  home   If you experience worsening of  your admission symptoms, develop shortness of breath, life threatening emergency, suicidal or homicidal thoughts you must seek medical attention immediately by calling 911 or calling your MD immediately  if symptoms less severe.  You Must read complete instructions/literature along with all the possible adverse reactions/side effects for all the Medicines you take and that have been prescribed to you. Take any new Medicines after you have completely understood and accpet all the possible adverse reactions/side effects.   Please note  You were cared for by a hospitalist during your hospital stay. If you have any questions about your  discharge medications or the care you received while you were in the hospital after you are discharged, you can call the unit and asked to speak with the hospitalist on call if the hospitalist that took care of you is not available. Once you are discharged, your primary care physician will handle any further medical issues. Please note that NO REFILLS for any discharge medications will be authorized once you are discharged, as it is imperative that you return to your primary care physician (or establish a relationship with a primary care physician if you do not have one) for your aftercare needs so that they can reassess your need for medications and monitor your lab values.    On the day of Discharge:   VITAL SIGNS:  Blood pressure 123/56, pulse 60, temperature 97.9 F (36.6 C), temperature source Oral, resp. rate 18, height  (1.676 m), weight 320 lb 11.2 oz (145.469 kg), SpO2 97 %.  I/O:   Intake/Output Summary (Last 24 hours) at 09/25/15 0757 Last data filed at 09/25/15 0634  Gross per 24 hour  Intake    240 ml  Output   1700 ml  Net  -1460 ml    PHYSICAL EXAMINATION:  GENERAL:  55 y.o.-year-old patient lying in the bed with no acute distress.  EYES: Pupils equal, round, reactive to light and accommodation. No scleral icterus. Extraocular muscles intact.  HEENT: Head atraumatic, normocephalic. Oropharynx and nasopharynx clear.  NECK:  Supple, no jugular venous distention. No thyroid enlargement, no tenderness.  LUNGS: Normal breath sounds bilaterally, no wheezing, rales,rhonchi or crepitation. No use of accessory muscles of respiration.  CARDIOVASCULAR: S1, S2 normal. No murmurs, rubs, or gallops.  ABDOMEN: Soft, non-tender, non-distended. Bowel sounds present. No organomegaly or mass.  EXTREMITIES: No pedal edema, cyanosis, or clubbing.  NEUROLOGIC: Cranial nerves II through XII are intact. Muscle strength 5/5 in all extremities. Sensation intact. Gait not checked.    PSYCHIATRIC: The patient is alert and oriented x 3.  SKIN: No obvious rash, lesion, or ulcer.   DATA REVIEW:   CBC  Recent Labs Lab 09/24/15 0056  WBC 7.8  HGB 14.0  HCT 41.4  PLT 208    Chemistries   Recent Labs Lab 09/24/15 0056  NA 140  K 3.9  CL 106  CO2 26  GLUCOSE 117*  BUN 18  CREATININE 0.88  CALCIUM 9.2  AST 27  ALT 26  ALKPHOS 82  BILITOT 0.7    Cardiac Enzymes  Recent Labs Lab 09/24/15 1422  TROPONINI 0.17*    Microbiology Results  No results found for this or any previous visit.  RADIOLOGY:  Dg Chest Port 1 View  09/24/2015  CLINICAL DATA:  Acute onset of generalized chest tightness and mild shortness of breath. Initial encounter. EXAM: PORTABLE CHEST 1 VIEW COMPARISON:  Chest radiograph performed 09/21/2015 FINDINGS: The lungs are well-aerated. Mild vascular congestion is noted. There is no  evidence of focal opacification, pleural effusion or pneumothorax. The cardiomediastinal silhouette is within normal limits. No acute osseous abnormalities are seen. IMPRESSION: Mild vascular congestion noted.  Lungs remain grossly clear. Electronically Signed   By: Roanna Raider M.D.   On: 09/24/2015 01:24     Management plans discussed with the patient, family and they are in agreement.  CODE STATUS:     Code Status Orders        Start     Ordered   09/24/15 0414  Full code   Continuous     09/24/15 0420    Code Status History    Date Active Date Inactive Code Status Order ID Comments User Context   09/22/2015  3:49 AM 09/23/2015  8:58 PM Full Code [id number]  Arnaldo Natal, MD Inpatient      TOTAL TIME TAKING CARE OF THIS PATIENT: 28 minutes.    Hower,  Mardi Mainland.D on 09/25/2015 at 7:57 AM  Between 7am to 6pm - Pager - [phone]  After 6pm go to [email or web address] - Social research officer, government  [employer] [place] [facility]  Office  [phone]  CC: Primary care physician; Jerl Mina, MD

## 2015-09-25 NOTE — Progress Notes (Signed)
A&O. Up with standby assist. VSS. No complaints. Probable discharge today.

## 2015-09-28 ENCOUNTER — Other Ambulatory Visit: Payer: Self-pay

## 2015-09-28 ENCOUNTER — Emergency Department
Admission: EM | Admit: 2015-09-28 | Discharge: 2015-09-28 | Disposition: A | Discharge status: Home / Self Care | Payer: BLUE CROSS/BLUE SHIELD | Attending: Emergency Medicine | Admitting: Emergency Medicine

## 2015-09-28 ENCOUNTER — Encounter: Payer: Self-pay | Admitting: Emergency Medicine

## 2015-09-28 DIAGNOSIS — Z79899 Other long term (current) drug therapy: Secondary | ICD-10-CM | POA: Insufficient documentation

## 2015-09-28 DIAGNOSIS — I252 Old myocardial infarction: Secondary | ICD-10-CM | POA: Diagnosis not present

## 2015-09-28 DIAGNOSIS — R002 Palpitations: Secondary | ICD-10-CM | POA: Insufficient documentation

## 2015-09-28 DIAGNOSIS — I10 Essential (primary) hypertension: Secondary | ICD-10-CM | POA: Insufficient documentation

## 2015-09-28 DIAGNOSIS — Z7982 Long term (current) use of aspirin: Secondary | ICD-10-CM | POA: Insufficient documentation

## 2015-09-28 DIAGNOSIS — Z87891 Personal history of nicotine dependence: Secondary | ICD-10-CM | POA: Insufficient documentation

## 2015-09-28 LAB — COMPREHENSIVE METABOLIC PANEL
ALK PHOS: 78 U/L (ref 38–126)
ALT: 30 U/L (ref 14–54)
AST: 26 U/L (ref 15–41)
Albumin: 4.5 g/dL (ref 3.5–5.0)
Anion gap: 9 (ref 5–15)
BILIRUBIN TOTAL: 0.7 mg/dL (ref 0.3–1.2)
BUN: 18 mg/dL (ref 6–20)
CALCIUM: 9.2 mg/dL (ref 8.9–10.3)
CO2: 25 mmol/L (ref 22–32)
CREATININE: 0.9 mg/dL (ref 0.44–1.00)
Chloride: 107 mmol/L (ref 101–111)
GFR calc non Af Amer: >60 mL/min (ref >60)
GLUCOSE: 101 mg/dL — AB (ref 65–99)
Potassium: 3.8 mmol/L (ref 3.5–5.1)
SODIUM: 141 mmol/L (ref 135–145)
Total Protein: 7.6 g/dL (ref 6.5–8.1)

## 2015-09-28 LAB — TROPONIN I
Troponin I: <0.03 ng/mL (ref <0.031)
Troponin I: <0.03 ng/mL (ref <0.031)

## 2015-09-28 LAB — CBC
HEMATOCRIT: 39.3 % (ref 35.0–47.0)
HEMOGLOBIN: 13.6 g/dL (ref 12.0–16.0)
MCH: 31.3 pg (ref 26.0–34.0)
MCHC: 34.5 g/dL (ref 32.0–36.0)
MCV: 90.6 fL (ref 80.0–100.0)
Platelets: 222 10*3/uL (ref 150–440)
RBC: 4.34 MIL/uL (ref 3.80–5.20)
RDW: 13.1 % (ref 11.5–14.5)
WBC: 7.2 10*3/uL (ref 3.6–11.0)

## 2015-09-28 LAB — GLUCOSE, CAPILLARY: GLUCOSE-CAPILLARY: 99 mg/dL (ref 65–99)

## 2015-09-28 NOTE — ED Notes (Signed)
States woke at 4am with sensation of palpitations. Started Flecainide yesterday. Recent cardiac admission.

## 2015-09-28 NOTE — ED Notes (Signed)
Pt took home medications at this per Dr. Mayford Knife.

## 2015-09-28 NOTE — ED Provider Notes (Signed)
[facility] Emergency Department Provider Note        Time seen: ----------------------------------------- 8:34 AM on 09/28/2015 -----------------------------------------    I have reviewed the triage vital signs and the nursing notes.   HISTORY  Chief Complaint Palpitations    HPI Michelle Rivers is a 55 y.o. female who presents ER for palpitations. Patient states that he awoke her from sleep, felt like her heart was racing. She's been seen recently for same, has had this happen before with atrial fibrillation. Reportedly she had a heart catheter this week as well as echocardiogram and is followed up with cardiology. She was admitted to the hospital for similar symptoms. She denies fevers chills or other complaints.   Past Medical History  Diagnosis Date  . Sleep apnea   . Sciatica   . Atrial fibrillation (HCC)   . Hypoglycemia   . Renal tubular acidosis   . Renal tubular acidosis   . Hypertension   . Thyroid disease   . Acute MI [facility] June 18th    Patient Active Problem List   Diagnosis Date Noted  . Chest pain 09/24/2015  . NSTEMI (non-ST elevated myocardial infarction) (HCC) 09/22/2015    Past Surgical History  Procedure Laterality Date  . Cholecystectomy    . Right oophorectomy    . Dilation and curettage of uterus    . Appendectomy    . Fracture surgery    . Hand surgery    . Plantar fascia release    . Cardiac catheterization Right 09/23/2015    Procedure: Left Heart Cath and Coronary Angiography;  Surgeon: Laurier Nancy, MD;  Location: [facility] INVASIVE CV LAB;  Service: Cardiovascular;  Laterality: Right;    Allergies Demerol  Social History Social History  Substance Use Topics  . Smoking status: Former Games developer  . Smokeless tobacco: None  . Alcohol Use: No    Review of Systems Constitutional: Negative for fever. Cardiovascular: Negative for chest pain.Positive for palpitations Respiratory: Negative for shortness of  breath. Gastrointestinal: Negative for abdominal pain, vomiting and diarrhea. Genitourinary: Negative for dysuria. Musculoskeletal: Negative for back pain. Skin: Negative for rash. Neurological: Negative for headaches, focal weakness or numbness.  10-point ROS otherwise negative.  ____________________________________________   PHYSICAL EXAM:  VITAL SIGNS: ED Triage Vitals  Enc Vitals Group     BP 09/28/15 0801 176/73 mmHg     Pulse Rate 09/28/15 0801 72     Resp 09/28/15 0801 18     Temp 09/28/15 0801 97.8 F (36.6 C)     Temp Source 09/28/15 0801 Oral     SpO2 09/28/15 0801 98 %     Weight 09/28/15 0801 315 lb (142.883 kg)     Height 09/28/15 0801  (1.676 m)     Head Cir --      Peak Flow --      Pain Score --      Pain Loc --      Pain Edu? --      Excl. in GC? --     Constitutional: Alert and oriented. Well appearing and in no distress.Anxious Eyes: Conjunctivae are normal. PERRL. Normal extraocular movements. ENT   Head: Normocephalic and atraumatic.   Nose: No congestion/rhinnorhea.   Mouth/Throat: Mucous membranes are moist.   Neck: No stridor. Cardiovascular: Normal rate, regular rhythm. No murmurs, rubs, or gallops. Respiratory: Normal respiratory effort without tachypnea nor retractions. Breath sounds are clear and equal bilaterally. No wheezes/rales/rhonchi. Gastrointestinal: Soft and nontender. Normal bowel sounds  Musculoskeletal: Nontender with normal range of motion in all extremities. No lower extremity tenderness nor edema. Neurologic:  Normal speech and language. No gross focal neurologic deficits are appreciated.  Skin:  Skin is warm, dry and intact. No rash noted. Psychiatric: Mood and affect are normal. Speech and behavior are normal.  ____________________________________________  EKG: Interpreted by me. Sinus rhythm with rate of 69 bpm, normal axis, normal intervals, no evidence of hypertrophy or acute infarction. Normal  EKG  ____________________________________________  ED COURSE:  Pertinent labs & imaging results that were available during my care of the patient were reviewed by me and considered in my medical decision making (see chart for details). Patient resents to ER with palpitations uncertain etiology. Likely panic attack. We will check cardiac labs and review recent cardiac workup. ____________________________________________    LABS (pertinent positives/negatives)  Labs Reviewed  COMPREHENSIVE METABOLIC PANEL - Abnormal; Notable for the following:    Glucose, Bld 101 (*)    All other components within normal limits  CBC  TROPONIN I  GLUCOSE, CAPILLARY  TROPONIN I   ____________________________________________  FINAL ASSESSMENT AND PLAN  Palpitations  Plan: Patient with labs as dictated above. Patient presented to the ER with palpitations seem more anxiety related than anything else. She may also have had paroxysmal A. fib, there is no finding here to suggest atrial fibrillation. I will encourage close outpatient follow-up with her doctor.   Emily Filbert, MD   Note: This dictation was prepared with Dragon dictation. Any transcriptional errors that result from this process are unintentional   Emily Filbert, MD 09/28/15 [phone]

## 2015-09-28 NOTE — Discharge Instructions (Signed)

## 2015-10-08 ENCOUNTER — Emergency Department
Admission: EM | Admit: 2015-10-08 | Discharge: 2015-10-09 | Disposition: A | Discharge status: Home / Self Care | Payer: BLUE CROSS/BLUE SHIELD | Attending: Emergency Medicine | Admitting: Emergency Medicine

## 2015-10-08 ENCOUNTER — Encounter: Payer: Self-pay | Admitting: Emergency Medicine

## 2015-10-08 DIAGNOSIS — R1013 Epigastric pain: Secondary | ICD-10-CM | POA: Diagnosis not present

## 2015-10-08 DIAGNOSIS — R0789 Other chest pain: Secondary | ICD-10-CM | POA: Insufficient documentation

## 2015-10-08 DIAGNOSIS — R1011 Right upper quadrant pain: Secondary | ICD-10-CM | POA: Diagnosis not present

## 2015-10-08 DIAGNOSIS — R1012 Left upper quadrant pain: Secondary | ICD-10-CM | POA: Diagnosis not present

## 2015-10-08 DIAGNOSIS — R079 Chest pain, unspecified: Secondary | ICD-10-CM

## 2015-10-08 DIAGNOSIS — I10 Essential (primary) hypertension: Secondary | ICD-10-CM | POA: Insufficient documentation

## 2015-10-08 DIAGNOSIS — Z87891 Personal history of nicotine dependence: Secondary | ICD-10-CM | POA: Diagnosis not present

## 2015-10-08 DIAGNOSIS — R109 Unspecified abdominal pain: Secondary | ICD-10-CM

## 2015-10-08 DIAGNOSIS — I4891 Unspecified atrial fibrillation: Secondary | ICD-10-CM | POA: Insufficient documentation

## 2015-10-08 LAB — COMPREHENSIVE METABOLIC PANEL
ALBUMIN: 4.5 g/dL (ref 3.5–5.0)
ALT: 25 U/L (ref 14–54)
AST: 23 U/L (ref 15–41)
Alkaline Phosphatase: 84 U/L (ref 38–126)
Anion gap: 7 (ref 5–15)
BILIRUBIN TOTAL: 0.8 mg/dL (ref 0.3–1.2)
BUN: 18 mg/dL (ref 6–20)
CO2: 30 mmol/L (ref 22–32)
CREATININE: 0.9 mg/dL (ref 0.44–1.00)
Calcium: 9.5 mg/dL (ref 8.9–10.3)
Chloride: 107 mmol/L (ref 101–111)
GFR calc Af Amer: >60 mL/min (ref >60)
GLUCOSE: 116 mg/dL — AB (ref 65–99)
POTASSIUM: 3.8 mmol/L (ref 3.5–5.1)
Sodium: 144 mmol/L (ref 135–145)
TOTAL PROTEIN: 7.8 g/dL (ref 6.5–8.1)

## 2015-10-08 LAB — CBC
HEMATOCRIT: 40.7 % (ref 35.0–47.0)
HEMOGLOBIN: 14.3 g/dL (ref 12.0–16.0)
MCH: 32.1 pg (ref 26.0–34.0)
MCHC: 35 g/dL (ref 32.0–36.0)
MCV: 91.6 fL (ref 80.0–100.0)
Platelets: 262 10*3/uL (ref 150–440)
RBC: 4.44 MIL/uL (ref 3.80–5.20)
RDW: 12.7 % (ref 11.5–14.5)
WBC: 7.6 10*3/uL (ref 3.6–11.0)

## 2015-10-08 LAB — PROTIME-INR
INR: 1.05
PROTHROMBIN TIME: 13.9 s (ref 11.4–15.0)

## 2015-10-08 LAB — TROPONIN I: Troponin I: <0.03 ng/mL (ref <0.03)

## 2015-10-08 NOTE — ED Provider Notes (Signed)
[facility] Emergency Department Provider Note  ____________________________________________  Time seen: 11:20 PM  I have reviewed the triage vital signs and the nursing notes.   HISTORY  Chief Complaint Chest Pain     HPI Michelle Rivers is a 55 y.o. female with history of acute MI atrial fibrillation hypertension presents with upper abdominal discomfort right upper quadrant epigastric/left upper quadrant with onset this evening. Patient describes pain as a bandlike tightness. Patient also admits to being "gassy and bloated. Patient denies any shortness of breath no weakness no nausea or vomiting. Patient states that the discomfort is consistent with what she was seen for on 09/28/2015   Past Medical History  Diagnosis Date  . Sleep apnea   . Sciatica   . Atrial fibrillation (HCC)   . Hypoglycemia   . Renal tubular acidosis   . Renal tubular acidosis   . Hypertension   . Thyroid disease   . Acute MI [facility]) June 18th    Patient Active Problem List   Diagnosis Date Noted  . Chest pain 09/24/2015  . NSTEMI (non-ST elevated myocardial infarction) (HCC) 09/22/2015    Past Surgical History  Procedure Laterality Date  . Cholecystectomy    . Right oophorectomy    . Dilation and curettage of uterus    . Appendectomy    . Fracture surgery    . Hand surgery    . Plantar fascia release    . Cardiac catheterization Right 09/23/2015    Procedure: Left Heart Cath and Coronary Angiography;  Surgeon: Laurier Nancy, MD;  Location: [facility] INVASIVE CV LAB;  Service: Cardiovascular;  Laterality: Right;    Current Outpatient Rx  Name  Route  Sig  Dispense  Refill  . acetaminophen (TYLENOL) 500 MG tablet   Oral   Take 500 mg by mouth every 6 (six) hours as needed for mild pain, fever or headache.         Marland Kitchen apixaban (ELIQUIS) 5 MG TABS tablet   Oral   Take 1 tablet (5 mg total) by mouth 2 (two) times daily.   60 tablet   0   . aspirin EC 81 MG tablet    Oral   Take 1 tablet (81 mg total) by mouth daily.   30 tablet   0   . atorvastatin (LIPITOR) 20 MG tablet   Oral   Take 1 tablet (20 mg total) by mouth daily.   30 tablet   0   . docusate sodium (COLACE) 100 MG capsule   Oral   Take 100 mg by mouth daily.         . flecainide (TAMBOCOR) 50 MG tablet   Oral   Take 50 mg by mouth 2 (two) times daily.         . isosorbide mononitrate (IMDUR) 30 MG 24 hr tablet   Oral   Take 1 tablet (30 mg total) by mouth daily.   30 tablet   0   . levothyroxine (SYNTHROID, LEVOTHROID) 112 MCG tablet   Oral   Take 112 mcg by mouth daily.         . metoprolol tartrate (LOPRESSOR) 25 MG tablet   Oral   Take 0.5 tablets (12.5 mg total) by mouth 2 (two) times daily.   60 tablet   0   . Multiple Vitamins-Minerals (PRESERVISION AREDS 2) CAPS   Oral   Take 1 capsule by mouth 2 (two) times daily.         Marland Kitchen  pantoprazole (PROTONIX) 40 MG tablet   Oral   Take 1 tablet (40 mg total) by mouth 2 (two) times daily.   60 tablet   0   . sucralfate (CARAFATE) 1 GM/10ML suspension   Oral   Take 10 mLs (1 g total) by mouth 4 (four) times daily -  with meals and at bedtime.   420 mL   0     Allergies Amoxicillin-pot clavulanate; Azithromycin; Codeine; Cyclobenzaprine; Demerol; Fluticasone; Ketorolac tromethamine; Loratadine; Nsaids; and Prednisone  Family History  Problem Relation Age of Onset  . CAD Mother   . CAD Father   . Liver cancer Brother     Social History Social History  Substance Use Topics  . Smoking status: Former Games developer  . Smokeless tobacco: None  . Alcohol Use: No    Review of Systems  Constitutional: Negative for fever. Eyes: Negative for visual changes. ENT: Negative for sore throat. Cardiovascular: Negative for chest pain. Respiratory: Negative for shortness of breath. Gastrointestinal:Positive for abdominal pain Genitourinary: Negative for dysuria. Musculoskeletal: Negative for back pain. Skin:  Negative for rash. Neurological: Negative for headaches, focal weakness or numbness.   10-point ROS otherwise negative.  ____________________________________________   PHYSICAL EXAM:  VITAL SIGNS: ED Triage Vitals  Enc Vitals Group     BP 10/08/15 2247 154/67 mmHg     Pulse Rate 10/08/15 2247 78     Resp 10/08/15 2247 18     Temp 10/08/15 2247 97.9 F (36.6 C)     Temp Source 10/08/15 2247 Oral     SpO2 10/08/15 2247 99 %     Weight 10/08/15 2247 315 lb (142.883 kg)     Height 10/08/15 2247 5\' 2"  (1.575 m)     Head Cir --      Peak Flow --      Pain Score 10/08/15 2248 2     Pain Loc --      Pain Edu? --      Excl. in GC? --      Constitutional: Alert and oriented. Well appearing and in no distress. Eyes: Conjunctivae are normal. PERRL. Normal extraocular movements. ENT   Head: Normocephalic and atraumatic.   Nose: No congestion/rhinnorhea.   Mouth/Throat: Mucous membranes are moist.   Neck: No stridor. Hematological/Lymphatic/Immunilogical: No cervical lymphadenopathy. Cardiovascular: Normal rate, regular rhythm. Normal and symmetric distal pulses are present in all extremities. No murmurs, rubs, or gallops. Respiratory: Normal respiratory effort without tachypnea nor retractions. Breath sounds are clear and equal bilaterally. No wheezes/rales/rhonchi. Gastrointestinal: Soft and nontender. No distention. There is no CVA tenderness. Genitourinary: deferred Musculoskeletal: Nontender with normal range of motion in all extremities. No joint effusions.  No lower extremity tenderness nor edema. Neurologic:  Normal speech and language. No gross focal neurologic deficits are appreciated. Speech is normal.  Skin:  Skin is warm, dry and intact. No rash noted. Psychiatric: Mood and affect are normal. Speech and behavior are normal. Patient exhibits appropriate insight and judgment.  ____________________________________________    LABS (pertinent  positives/negatives)  Labs Reviewed  COMPREHENSIVE METABOLIC PANEL - Abnormal; Notable for the following:    Glucose, Bld 116 (*)    All other components within normal limits  GLUCOSE, CAPILLARY - Abnormal; Notable for the following:    Glucose-Capillary 100 (*)    All other components within normal limits  CBC  PROTIME-INR  TROPONIN I  TROPONIN I     ____________________________________________   EKG  ED ECG REPORT I, [place] Rollande Thursby, the attending  physician, personally viewed and interpreted this ECG.   Date: 10/09/2015  EKG Time: 10:42 PM  Rate: 78  Rhythm: Normal sinus rhythm  Intervals: Normal  ST&T Change: None   ____________________________________________    RADIOLOGY  CT Abdomen Pelvis Wo Contrast (Final result) Result time: 10/09/15 02:13:41   Final result by Rad Results In Interface (10/09/15 02:13:41)   Narrative:   CLINICAL DATA: Epigastric, right, and left upper quadrant pain.  EXAM: CT ABDOMEN AND PELVIS WITHOUT CONTRAST  TECHNIQUE: Multidetector CT imaging of the abdomen and pelvis was performed following the standard protocol without IV contrast.  COMPARISON: None.  FINDINGS: Lower chest: Tiny pleural-based nodules versus pleural thickening in the lower left hemithorax. No pleural effusion.  Liver: Borderline hepatic steatosis. No evidence of focal lesion allowing for lack contrast.  Hepatobiliary: Clips in the gallbladder fossa postcholecystectomy. Prominent common bile duct measuring 9 mm at the porta hepatis, likely sequela of cholecystectomy.  Pancreas: No ductal dilatation or inflammation.  Spleen: Normal.  Adrenal glands: No nodule.  Kidneys: Symmetric in size without stones or hydronephrosis. There is no perinephric stranding. Both ureters are decompressed without stones along the course.  Stomach/Bowel: Small hiatal hernia. Stomach physiologically distended. There are no dilated or thickened small bowel  loops. Moderate volume of stool in the right and transverse colon, more distal colon is decompressed. Descending and sigmoid colon redundancy. No colonic wall thickening or inflammation. The appendix is surgically absent.  Vascular/Lymphatic: Small retroperitoneal and central mesenteric nodes, not enlarged by size criteria. Abdominal aorta is normal in caliber. Trace atherosclerosis.  Reproductive: The uterus is normal in size. Left ovary normal in size. Right ovary surgically absent. No adnexal mass.  Bladder: Physiologically distended without wall thickening.  Other: No free air, free fluid, or intra-abdominal fluid collection.  Musculoskeletal: There are no acute or suspicious osseous abnormalities.  IMPRESSION: 1. No acute abnormality in the abdomen/pelvis. 2. Small hiatal hernia. 3. Postcholecystectomy. Extrahepatic biliary prominence likely secondary to prior cholecystectomy.   Electronically Signed By: Rubye Oaks M.D. On: 10/09/2015 02:13     ___________________  Procedures      INITIAL IMPRESSION / ASSESSMENT AND PLAN / ED COURSE  Pertinent labs & imaging results that were available during my care of the patient were reviewed by me and considered in my medical decision making (see chart for details).  EKG revealed no evidence of ST segment elevation or depression troponin negative 2. No clear etiology for the patient's upper abdominal pain identified CT scan of the abdomen revealed a small hiatal hernia but no other gross abnormalities. Patient referred to Dr. Marva Panda gastrologist for further outpatient evaluation  ____________________________________________   FINAL CLINICAL IMPRESSION(S) / ED DIAGNOSES  Final diagnoses:  Chest pain, unspecified chest pain type  Abdominal pain, unspecified abdominal location      Darci Current, MD 10/09/15 [phone]

## 2015-10-08 NOTE — ED Notes (Signed)
Pt to triage via POV with c/o tightness in chest (same as last visit, last week) located at "wear the bra strap is", pt also reports being gassy and bloated.  Pt denies: SOB, weakness, N/V sweating.  Hx of AFIB, GERD, HTN.

## 2015-10-09 ENCOUNTER — Emergency Department: Payer: BLUE CROSS/BLUE SHIELD

## 2015-10-09 LAB — TROPONIN I

## 2015-10-09 LAB — GLUCOSE, CAPILLARY: Glucose-Capillary: 100 mg/dL — ABNORMAL HIGH (ref 65–99)

## 2015-10-09 MED ORDER — DIATRIZOATE MEGLUMINE & SODIUM 66-10 % PO SOLN
15.0000 mL | ORAL | Status: AC
  Administered 2015-10-09: 15 mL via ORAL

## 2015-10-09 NOTE — Discharge Instructions (Signed)

## 2015-10-29 ENCOUNTER — Encounter: Payer: Self-pay | Admitting: Internal Medicine

## 2015-11-15 ENCOUNTER — Other Ambulatory Visit: Payer: Self-pay | Admitting: Internal Medicine

## 2015-11-15 DIAGNOSIS — Z1231 Encounter for screening mammogram for malignant neoplasm of breast: Secondary | ICD-10-CM

## 2015-12-04 ENCOUNTER — Ambulatory Visit: Payer: BLUE CROSS/BLUE SHIELD

## 2015-12-04 ENCOUNTER — Ambulatory Visit
Admission: RE | Admit: 2015-12-04 | Discharge: 2015-12-04 | Disposition: A | Discharge status: Home / Self Care | Payer: BLUE CROSS/BLUE SHIELD | Source: Ambulatory Visit | Attending: Internal Medicine | Admitting: Internal Medicine

## 2015-12-04 ENCOUNTER — Other Ambulatory Visit: Payer: Self-pay | Admitting: Internal Medicine

## 2015-12-04 DIAGNOSIS — Z1231 Encounter for screening mammogram for malignant neoplasm of breast: Secondary | ICD-10-CM | POA: Diagnosis present

## 2015-12-06 ENCOUNTER — Ambulatory Visit: Payer: BLUE CROSS/BLUE SHIELD

## 2015-12-12 DIAGNOSIS — E669 Obesity, unspecified: Secondary | ICD-10-CM | POA: Diagnosis not present

## 2015-12-12 DIAGNOSIS — E785 Hyperlipidemia, unspecified: Secondary | ICD-10-CM | POA: Diagnosis not present

## 2015-12-12 DIAGNOSIS — E559 Vitamin D deficiency, unspecified: Secondary | ICD-10-CM | POA: Diagnosis not present

## 2015-12-12 DIAGNOSIS — I10 Essential (primary) hypertension: Secondary | ICD-10-CM | POA: Diagnosis not present

## 2015-12-17 DIAGNOSIS — G4733 Obstructive sleep apnea (adult) (pediatric): Secondary | ICD-10-CM | POA: Diagnosis not present

## 2016-01-09 DIAGNOSIS — Z7689 Persons encountering health services in other specified circumstances: Secondary | ICD-10-CM | POA: Diagnosis not present

## 2016-01-09 DIAGNOSIS — Z6841 Body Mass Index (BMI) 40.0 and over, adult: Secondary | ICD-10-CM | POA: Diagnosis not present

## 2016-01-09 DIAGNOSIS — K219 Gastro-esophageal reflux disease without esophagitis: Secondary | ICD-10-CM | POA: Diagnosis not present

## 2016-01-09 DIAGNOSIS — I2510 Atherosclerotic heart disease of native coronary artery without angina pectoris: Secondary | ICD-10-CM | POA: Diagnosis not present

## 2016-01-09 DIAGNOSIS — M5430 Sciatica, unspecified side: Secondary | ICD-10-CM | POA: Diagnosis not present

## 2016-01-09 DIAGNOSIS — Z630 Problems in relationship with spouse or partner: Secondary | ICD-10-CM | POA: Diagnosis not present

## 2016-01-09 DIAGNOSIS — I480 Paroxysmal atrial fibrillation: Secondary | ICD-10-CM | POA: Diagnosis not present

## 2016-01-09 DIAGNOSIS — G4733 Obstructive sleep apnea (adult) (pediatric): Secondary | ICD-10-CM | POA: Diagnosis not present

## 2016-01-13 DIAGNOSIS — Z6841 Body Mass Index (BMI) 40.0 and over, adult: Secondary | ICD-10-CM | POA: Diagnosis not present

## 2016-01-13 DIAGNOSIS — Z79899 Other long term (current) drug therapy: Secondary | ICD-10-CM | POA: Diagnosis not present

## 2016-01-13 DIAGNOSIS — Z9989 Dependence on other enabling machines and devices: Secondary | ICD-10-CM | POA: Diagnosis not present

## 2016-01-13 DIAGNOSIS — K30 Functional dyspepsia: Secondary | ICD-10-CM | POA: Diagnosis not present

## 2016-01-13 DIAGNOSIS — Z7901 Long term (current) use of anticoagulants: Secondary | ICD-10-CM | POA: Diagnosis not present

## 2016-01-13 DIAGNOSIS — R0789 Other chest pain: Secondary | ICD-10-CM | POA: Diagnosis not present

## 2016-01-13 DIAGNOSIS — I4891 Unspecified atrial fibrillation: Secondary | ICD-10-CM | POA: Diagnosis not present

## 2016-01-13 DIAGNOSIS — G4733 Obstructive sleep apnea (adult) (pediatric): Secondary | ICD-10-CM | POA: Diagnosis not present

## 2016-01-13 DIAGNOSIS — R5381 Other malaise: Secondary | ICD-10-CM | POA: Diagnosis not present

## 2016-01-13 DIAGNOSIS — Z87891 Personal history of nicotine dependence: Secondary | ICD-10-CM | POA: Diagnosis not present

## 2016-01-13 DIAGNOSIS — E669 Obesity, unspecified: Secondary | ICD-10-CM | POA: Diagnosis not present

## 2016-01-13 DIAGNOSIS — I2510 Atherosclerotic heart disease of native coronary artery without angina pectoris: Secondary | ICD-10-CM | POA: Diagnosis not present

## 2016-01-13 DIAGNOSIS — K219 Gastro-esophageal reflux disease without esophagitis: Secondary | ICD-10-CM | POA: Diagnosis not present

## 2016-01-13 DIAGNOSIS — E039 Hypothyroidism, unspecified: Secondary | ICD-10-CM | POA: Diagnosis not present

## 2016-01-13 DIAGNOSIS — R079 Chest pain, unspecified: Secondary | ICD-10-CM | POA: Diagnosis not present

## 2016-01-13 DIAGNOSIS — Z7982 Long term (current) use of aspirin: Secondary | ICD-10-CM | POA: Diagnosis not present

## 2016-01-14 DIAGNOSIS — G4733 Obstructive sleep apnea (adult) (pediatric): Secondary | ICD-10-CM | POA: Diagnosis not present

## 2016-01-14 DIAGNOSIS — R5381 Other malaise: Secondary | ICD-10-CM | POA: Diagnosis not present

## 2016-01-14 DIAGNOSIS — I2510 Atherosclerotic heart disease of native coronary artery without angina pectoris: Secondary | ICD-10-CM | POA: Diagnosis not present

## 2016-01-14 DIAGNOSIS — E669 Obesity, unspecified: Secondary | ICD-10-CM | POA: Diagnosis not present

## 2016-01-14 DIAGNOSIS — R079 Chest pain, unspecified: Secondary | ICD-10-CM | POA: Diagnosis not present

## 2016-01-14 DIAGNOSIS — Z9989 Dependence on other enabling machines and devices: Secondary | ICD-10-CM | POA: Diagnosis not present

## 2016-01-14 DIAGNOSIS — Z87891 Personal history of nicotine dependence: Secondary | ICD-10-CM | POA: Diagnosis not present

## 2016-01-14 DIAGNOSIS — K30 Functional dyspepsia: Secondary | ICD-10-CM | POA: Diagnosis not present

## 2016-01-14 DIAGNOSIS — K219 Gastro-esophageal reflux disease without esophagitis: Secondary | ICD-10-CM | POA: Diagnosis not present

## 2016-01-14 DIAGNOSIS — Z6841 Body Mass Index (BMI) 40.0 and over, adult: Secondary | ICD-10-CM | POA: Diagnosis not present

## 2016-01-14 DIAGNOSIS — E039 Hypothyroidism, unspecified: Secondary | ICD-10-CM | POA: Diagnosis not present

## 2016-01-14 DIAGNOSIS — I4891 Unspecified atrial fibrillation: Secondary | ICD-10-CM | POA: Diagnosis not present

## 2016-01-15 DIAGNOSIS — R079 Chest pain, unspecified: Secondary | ICD-10-CM | POA: Diagnosis not present

## 2016-01-15 DIAGNOSIS — R5381 Other malaise: Secondary | ICD-10-CM | POA: Diagnosis not present

## 2016-01-15 DIAGNOSIS — I2510 Atherosclerotic heart disease of native coronary artery without angina pectoris: Secondary | ICD-10-CM | POA: Diagnosis not present

## 2016-01-15 DIAGNOSIS — I4891 Unspecified atrial fibrillation: Secondary | ICD-10-CM | POA: Diagnosis not present

## 2016-01-15 DIAGNOSIS — E669 Obesity, unspecified: Secondary | ICD-10-CM | POA: Diagnosis not present

## 2016-01-16 DIAGNOSIS — G4733 Obstructive sleep apnea (adult) (pediatric): Secondary | ICD-10-CM | POA: Diagnosis not present

## 2016-01-17 DIAGNOSIS — Z09 Encounter for follow-up examination after completed treatment for conditions other than malignant neoplasm: Secondary | ICD-10-CM | POA: Diagnosis not present

## 2016-01-17 DIAGNOSIS — Z6841 Body Mass Index (BMI) 40.0 and over, adult: Secondary | ICD-10-CM | POA: Diagnosis not present

## 2016-01-17 DIAGNOSIS — F439 Reaction to severe stress, unspecified: Secondary | ICD-10-CM | POA: Diagnosis not present

## 2016-01-17 DIAGNOSIS — G4733 Obstructive sleep apnea (adult) (pediatric): Secondary | ICD-10-CM | POA: Diagnosis not present

## 2016-01-17 DIAGNOSIS — I2510 Atherosclerotic heart disease of native coronary artery without angina pectoris: Secondary | ICD-10-CM | POA: Diagnosis not present

## 2016-01-17 DIAGNOSIS — K319 Disease of stomach and duodenum, unspecified: Secondary | ICD-10-CM | POA: Diagnosis not present

## 2016-01-23 DIAGNOSIS — N950 Postmenopausal bleeding: Secondary | ICD-10-CM | POA: Diagnosis not present

## 2016-02-04 DIAGNOSIS — K219 Gastro-esophageal reflux disease without esophagitis: Secondary | ICD-10-CM | POA: Diagnosis not present

## 2016-02-04 DIAGNOSIS — I480 Paroxysmal atrial fibrillation: Secondary | ICD-10-CM | POA: Diagnosis not present

## 2016-02-04 DIAGNOSIS — I2510 Atherosclerotic heart disease of native coronary artery without angina pectoris: Secondary | ICD-10-CM | POA: Diagnosis not present

## 2016-02-04 DIAGNOSIS — E785 Hyperlipidemia, unspecified: Secondary | ICD-10-CM | POA: Diagnosis not present

## 2016-02-04 DIAGNOSIS — E039 Hypothyroidism, unspecified: Secondary | ICD-10-CM | POA: Diagnosis not present

## 2016-02-04 DIAGNOSIS — F419 Anxiety disorder, unspecified: Secondary | ICD-10-CM | POA: Diagnosis not present

## 2016-02-04 DIAGNOSIS — G4733 Obstructive sleep apnea (adult) (pediatric): Secondary | ICD-10-CM | POA: Diagnosis not present

## 2016-02-04 DIAGNOSIS — I10 Essential (primary) hypertension: Secondary | ICD-10-CM | POA: Diagnosis not present

## 2016-02-04 DIAGNOSIS — Z6841 Body Mass Index (BMI) 40.0 and over, adult: Secondary | ICD-10-CM | POA: Diagnosis not present

## 2016-02-10 DIAGNOSIS — R739 Hyperglycemia, unspecified: Secondary | ICD-10-CM | POA: Diagnosis not present

## 2016-02-10 DIAGNOSIS — I480 Paroxysmal atrial fibrillation: Secondary | ICD-10-CM | POA: Diagnosis not present

## 2016-02-10 DIAGNOSIS — N950 Postmenopausal bleeding: Secondary | ICD-10-CM | POA: Diagnosis not present

## 2016-02-10 DIAGNOSIS — Z6841 Body Mass Index (BMI) 40.0 and over, adult: Secondary | ICD-10-CM | POA: Diagnosis not present

## 2016-02-10 DIAGNOSIS — I2510 Atherosclerotic heart disease of native coronary artery without angina pectoris: Secondary | ICD-10-CM | POA: Diagnosis not present

## 2016-02-10 DIAGNOSIS — E559 Vitamin D deficiency, unspecified: Secondary | ICD-10-CM | POA: Diagnosis not present

## 2016-02-14 DIAGNOSIS — Z79899 Other long term (current) drug therapy: Secondary | ICD-10-CM | POA: Diagnosis not present

## 2016-02-14 DIAGNOSIS — J9811 Atelectasis: Secondary | ICD-10-CM | POA: Diagnosis not present

## 2016-02-14 DIAGNOSIS — K219 Gastro-esophageal reflux disease without esophagitis: Secondary | ICD-10-CM | POA: Diagnosis not present

## 2016-02-14 DIAGNOSIS — Z87891 Personal history of nicotine dependence: Secondary | ICD-10-CM | POA: Diagnosis not present

## 2016-02-14 DIAGNOSIS — I4891 Unspecified atrial fibrillation: Secondary | ICD-10-CM | POA: Diagnosis not present

## 2016-02-14 DIAGNOSIS — R079 Chest pain, unspecified: Secondary | ICD-10-CM | POA: Diagnosis not present

## 2016-02-14 DIAGNOSIS — I2510 Atherosclerotic heart disease of native coronary artery without angina pectoris: Secondary | ICD-10-CM | POA: Diagnosis not present

## 2016-02-16 DIAGNOSIS — G4733 Obstructive sleep apnea (adult) (pediatric): Secondary | ICD-10-CM | POA: Diagnosis not present

## 2016-02-20 DIAGNOSIS — N83201 Unspecified ovarian cyst, right side: Secondary | ICD-10-CM | POA: Diagnosis not present

## 2016-02-20 DIAGNOSIS — N83202 Unspecified ovarian cyst, left side: Secondary | ICD-10-CM | POA: Diagnosis not present

## 2016-02-20 DIAGNOSIS — N950 Postmenopausal bleeding: Secondary | ICD-10-CM | POA: Diagnosis not present

## 2016-03-03 DIAGNOSIS — Z6841 Body Mass Index (BMI) 40.0 and over, adult: Secondary | ICD-10-CM | POA: Diagnosis not present

## 2016-03-03 DIAGNOSIS — I7300 Raynaud's syndrome without gangrene: Secondary | ICD-10-CM | POA: Diagnosis not present

## 2016-03-03 DIAGNOSIS — E785 Hyperlipidemia, unspecified: Secondary | ICD-10-CM | POA: Diagnosis not present

## 2016-03-03 DIAGNOSIS — I2510 Atherosclerotic heart disease of native coronary artery without angina pectoris: Secondary | ICD-10-CM | POA: Diagnosis not present

## 2016-03-03 DIAGNOSIS — G4733 Obstructive sleep apnea (adult) (pediatric): Secondary | ICD-10-CM | POA: Diagnosis not present

## 2016-03-03 DIAGNOSIS — I480 Paroxysmal atrial fibrillation: Secondary | ICD-10-CM | POA: Diagnosis not present

## 2016-03-03 DIAGNOSIS — I10 Essential (primary) hypertension: Secondary | ICD-10-CM | POA: Diagnosis not present

## 2016-03-03 DIAGNOSIS — K219 Gastro-esophageal reflux disease without esophagitis: Secondary | ICD-10-CM | POA: Diagnosis not present

## 2016-03-03 DIAGNOSIS — E039 Hypothyroidism, unspecified: Secondary | ICD-10-CM | POA: Diagnosis not present

## 2016-03-06 DIAGNOSIS — R635 Abnormal weight gain: Secondary | ICD-10-CM | POA: Diagnosis not present

## 2016-03-06 DIAGNOSIS — R739 Hyperglycemia, unspecified: Secondary | ICD-10-CM | POA: Diagnosis not present

## 2016-03-06 DIAGNOSIS — I214 Non-ST elevation (NSTEMI) myocardial infarction: Secondary | ICD-10-CM | POA: Diagnosis not present

## 2016-03-06 DIAGNOSIS — E162 Hypoglycemia, unspecified: Secondary | ICD-10-CM | POA: Diagnosis not present

## 2016-03-06 DIAGNOSIS — Z6841 Body Mass Index (BMI) 40.0 and over, adult: Secondary | ICD-10-CM | POA: Diagnosis not present

## 2016-03-19 DIAGNOSIS — Z5181 Encounter for therapeutic drug level monitoring: Secondary | ICD-10-CM | POA: Diagnosis not present

## 2016-03-19 DIAGNOSIS — Z79899 Other long term (current) drug therapy: Secondary | ICD-10-CM | POA: Diagnosis not present

## 2016-03-23 DIAGNOSIS — Z6841 Body Mass Index (BMI) 40.0 and over, adult: Secondary | ICD-10-CM | POA: Diagnosis not present

## 2016-03-23 DIAGNOSIS — Z713 Dietary counseling and surveillance: Secondary | ICD-10-CM | POA: Diagnosis not present

## 2016-03-27 DIAGNOSIS — R0789 Other chest pain: Secondary | ICD-10-CM | POA: Diagnosis not present

## 2016-03-27 DIAGNOSIS — Z79899 Other long term (current) drug therapy: Secondary | ICD-10-CM | POA: Diagnosis not present

## 2016-03-27 DIAGNOSIS — R3129 Other microscopic hematuria: Secondary | ICD-10-CM | POA: Diagnosis not present

## 2016-03-27 DIAGNOSIS — R079 Chest pain, unspecified: Secondary | ICD-10-CM | POA: Diagnosis not present

## 2016-03-27 DIAGNOSIS — I4891 Unspecified atrial fibrillation: Secondary | ICD-10-CM | POA: Diagnosis not present

## 2016-03-27 DIAGNOSIS — Z87891 Personal history of nicotine dependence: Secondary | ICD-10-CM | POA: Diagnosis not present

## 2016-03-27 DIAGNOSIS — I2510 Atherosclerotic heart disease of native coronary artery without angina pectoris: Secondary | ICD-10-CM | POA: Diagnosis not present

## 2016-04-14 DIAGNOSIS — Z7901 Long term (current) use of anticoagulants: Secondary | ICD-10-CM | POA: Diagnosis not present

## 2016-04-14 DIAGNOSIS — Z01818 Encounter for other preprocedural examination: Secondary | ICD-10-CM | POA: Diagnosis not present

## 2016-04-14 DIAGNOSIS — Z6841 Body Mass Index (BMI) 40.0 and over, adult: Secondary | ICD-10-CM | POA: Diagnosis not present

## 2016-04-14 DIAGNOSIS — G4733 Obstructive sleep apnea (adult) (pediatric): Secondary | ICD-10-CM | POA: Diagnosis not present

## 2016-04-14 DIAGNOSIS — E785 Hyperlipidemia, unspecified: Secondary | ICD-10-CM | POA: Diagnosis not present

## 2016-04-14 DIAGNOSIS — M545 Low back pain: Secondary | ICD-10-CM | POA: Diagnosis not present

## 2016-04-14 DIAGNOSIS — I10 Essential (primary) hypertension: Secondary | ICD-10-CM | POA: Diagnosis not present

## 2016-04-14 DIAGNOSIS — N950 Postmenopausal bleeding: Secondary | ICD-10-CM | POA: Diagnosis not present

## 2016-04-14 DIAGNOSIS — E039 Hypothyroidism, unspecified: Secondary | ICD-10-CM | POA: Diagnosis not present

## 2016-04-14 DIAGNOSIS — I2510 Atherosclerotic heart disease of native coronary artery without angina pectoris: Secondary | ICD-10-CM | POA: Diagnosis not present

## 2016-04-14 DIAGNOSIS — R609 Edema, unspecified: Secondary | ICD-10-CM | POA: Diagnosis not present

## 2016-04-14 DIAGNOSIS — R739 Hyperglycemia, unspecified: Secondary | ICD-10-CM | POA: Diagnosis not present

## 2016-04-14 DIAGNOSIS — I480 Paroxysmal atrial fibrillation: Secondary | ICD-10-CM | POA: Diagnosis not present

## 2016-04-22 DIAGNOSIS — Z6841 Body Mass Index (BMI) 40.0 and over, adult: Secondary | ICD-10-CM | POA: Diagnosis not present

## 2016-05-18 DIAGNOSIS — G4733 Obstructive sleep apnea (adult) (pediatric): Secondary | ICD-10-CM | POA: Diagnosis not present

## 2016-05-19 DIAGNOSIS — E039 Hypothyroidism, unspecified: Secondary | ICD-10-CM | POA: Diagnosis not present

## 2016-05-19 DIAGNOSIS — N950 Postmenopausal bleeding: Secondary | ICD-10-CM | POA: Diagnosis not present

## 2016-05-19 DIAGNOSIS — Z8742 Personal history of other diseases of the female genital tract: Secondary | ICD-10-CM | POA: Diagnosis not present

## 2016-05-19 DIAGNOSIS — I480 Paroxysmal atrial fibrillation: Secondary | ICD-10-CM | POA: Diagnosis not present

## 2016-05-19 DIAGNOSIS — Z6841 Body Mass Index (BMI) 40.0 and over, adult: Secondary | ICD-10-CM | POA: Diagnosis not present

## 2016-05-19 DIAGNOSIS — N8500 Endometrial hyperplasia, unspecified: Secondary | ICD-10-CM | POA: Diagnosis not present

## 2016-05-19 DIAGNOSIS — I2510 Atherosclerotic heart disease of native coronary artery without angina pectoris: Secondary | ICD-10-CM | POA: Diagnosis not present

## 2016-05-19 DIAGNOSIS — Z1211 Encounter for screening for malignant neoplasm of colon: Secondary | ICD-10-CM | POA: Diagnosis not present

## 2016-05-19 DIAGNOSIS — R609 Edema, unspecified: Secondary | ICD-10-CM | POA: Diagnosis not present

## 2016-05-19 DIAGNOSIS — Z1212 Encounter for screening for malignant neoplasm of rectum: Secondary | ICD-10-CM | POA: Diagnosis not present

## 2016-05-19 DIAGNOSIS — G4733 Obstructive sleep apnea (adult) (pediatric): Secondary | ICD-10-CM | POA: Diagnosis not present

## 2016-05-22 DIAGNOSIS — I214 Non-ST elevation (NSTEMI) myocardial infarction: Secondary | ICD-10-CM | POA: Diagnosis not present

## 2016-05-22 DIAGNOSIS — E559 Vitamin D deficiency, unspecified: Secondary | ICD-10-CM | POA: Diagnosis not present

## 2016-05-22 DIAGNOSIS — R748 Abnormal levels of other serum enzymes: Secondary | ICD-10-CM | POA: Diagnosis not present

## 2016-05-22 DIAGNOSIS — E039 Hypothyroidism, unspecified: Secondary | ICD-10-CM | POA: Diagnosis not present

## 2016-05-27 DIAGNOSIS — H353131 Nonexudative age-related macular degeneration, bilateral, early dry stage: Secondary | ICD-10-CM | POA: Diagnosis not present

## 2016-05-27 DIAGNOSIS — H31001 Unspecified chorioretinal scars, right eye: Secondary | ICD-10-CM | POA: Diagnosis not present

## 2016-05-27 DIAGNOSIS — H2513 Age-related nuclear cataract, bilateral: Secondary | ICD-10-CM | POA: Diagnosis not present

## 2016-05-27 DIAGNOSIS — H25043 Posterior subcapsular polar age-related cataract, bilateral: Secondary | ICD-10-CM | POA: Diagnosis not present

## 2016-05-28 DIAGNOSIS — M79642 Pain in left hand: Secondary | ICD-10-CM | POA: Diagnosis not present

## 2016-05-28 DIAGNOSIS — Z6841 Body Mass Index (BMI) 40.0 and over, adult: Secondary | ICD-10-CM | POA: Diagnosis not present

## 2016-05-28 DIAGNOSIS — M25532 Pain in left wrist: Secondary | ICD-10-CM | POA: Diagnosis not present

## 2016-06-02 DIAGNOSIS — L83 Acanthosis nigricans: Secondary | ICD-10-CM | POA: Diagnosis not present

## 2016-06-02 DIAGNOSIS — E161 Other hypoglycemia: Secondary | ICD-10-CM | POA: Diagnosis not present

## 2016-06-02 DIAGNOSIS — Z87448 Personal history of other diseases of urinary system: Secondary | ICD-10-CM | POA: Diagnosis not present

## 2016-06-02 DIAGNOSIS — R319 Hematuria, unspecified: Secondary | ICD-10-CM | POA: Diagnosis not present

## 2016-06-02 DIAGNOSIS — Z6841 Body Mass Index (BMI) 40.0 and over, adult: Secondary | ICD-10-CM | POA: Diagnosis not present

## 2016-06-02 DIAGNOSIS — E611 Iron deficiency: Secondary | ICD-10-CM | POA: Diagnosis not present

## 2016-06-02 DIAGNOSIS — R5382 Chronic fatigue, unspecified: Secondary | ICD-10-CM | POA: Diagnosis not present

## 2016-06-02 DIAGNOSIS — G4730 Sleep apnea, unspecified: Secondary | ICD-10-CM | POA: Diagnosis not present

## 2016-06-02 DIAGNOSIS — X58XXXS Exposure to other specified factors, sequela: Secondary | ICD-10-CM | POA: Diagnosis not present

## 2016-06-02 DIAGNOSIS — S060X1S Concussion with loss of consciousness of 30 minutes or less, sequela: Secondary | ICD-10-CM | POA: Diagnosis not present

## 2016-06-02 DIAGNOSIS — E049 Nontoxic goiter, unspecified: Secondary | ICD-10-CM | POA: Diagnosis not present

## 2016-06-02 DIAGNOSIS — E519 Thiamine deficiency, unspecified: Secondary | ICD-10-CM | POA: Diagnosis not present

## 2016-06-02 DIAGNOSIS — N2589 Other disorders resulting from impaired renal tubular function: Secondary | ICD-10-CM | POA: Diagnosis not present

## 2016-06-02 DIAGNOSIS — N393 Stress incontinence (female) (male): Secondary | ICD-10-CM | POA: Diagnosis not present

## 2016-06-02 DIAGNOSIS — R3915 Urgency of urination: Secondary | ICD-10-CM | POA: Diagnosis not present

## 2016-06-02 DIAGNOSIS — R351 Nocturia: Secondary | ICD-10-CM | POA: Diagnosis not present

## 2016-06-02 DIAGNOSIS — R600 Localized edema: Secondary | ICD-10-CM | POA: Diagnosis not present

## 2016-06-02 DIAGNOSIS — I214 Non-ST elevation (NSTEMI) myocardial infarction: Secondary | ICD-10-CM | POA: Diagnosis not present

## 2016-06-02 DIAGNOSIS — K5904 Chronic idiopathic constipation: Secondary | ICD-10-CM | POA: Diagnosis not present

## 2016-06-02 DIAGNOSIS — I2510 Atherosclerotic heart disease of native coronary artery without angina pectoris: Secondary | ICD-10-CM | POA: Diagnosis not present

## 2016-06-02 DIAGNOSIS — Z87828 Personal history of other (healed) physical injury and trauma: Secondary | ICD-10-CM | POA: Diagnosis not present

## 2016-06-02 DIAGNOSIS — E785 Hyperlipidemia, unspecified: Secondary | ICD-10-CM | POA: Diagnosis not present

## 2016-06-02 DIAGNOSIS — R39198 Other difficulties with micturition: Secondary | ICD-10-CM | POA: Diagnosis not present

## 2016-06-02 DIAGNOSIS — R350 Frequency of micturition: Secondary | ICD-10-CM | POA: Diagnosis not present

## 2016-06-02 DIAGNOSIS — N951 Menopausal and female climacteric states: Secondary | ICD-10-CM | POA: Diagnosis not present

## 2016-06-02 DIAGNOSIS — I480 Paroxysmal atrial fibrillation: Secondary | ICD-10-CM | POA: Diagnosis not present

## 2016-06-02 DIAGNOSIS — I4891 Unspecified atrial fibrillation: Secondary | ICD-10-CM | POA: Diagnosis not present

## 2016-06-02 DIAGNOSIS — R339 Retention of urine, unspecified: Secondary | ICD-10-CM | POA: Diagnosis not present

## 2016-06-02 DIAGNOSIS — R3912 Poor urinary stream: Secondary | ICD-10-CM | POA: Diagnosis not present

## 2016-06-02 DIAGNOSIS — R5383 Other fatigue: Secondary | ICD-10-CM | POA: Diagnosis not present

## 2016-06-02 DIAGNOSIS — E039 Hypothyroidism, unspecified: Secondary | ICD-10-CM | POA: Diagnosis not present

## 2016-06-02 DIAGNOSIS — N8500 Endometrial hyperplasia, unspecified: Secondary | ICD-10-CM | POA: Diagnosis not present

## 2016-06-02 DIAGNOSIS — Z01810 Encounter for preprocedural cardiovascular examination: Secondary | ICD-10-CM | POA: Diagnosis not present

## 2016-06-02 DIAGNOSIS — D500 Iron deficiency anemia secondary to blood loss (chronic): Secondary | ICD-10-CM | POA: Diagnosis not present

## 2016-06-02 DIAGNOSIS — R109 Unspecified abdominal pain: Secondary | ICD-10-CM | POA: Diagnosis not present

## 2016-06-02 DIAGNOSIS — Z7901 Long term (current) use of anticoagulants: Secondary | ICD-10-CM | POA: Diagnosis not present

## 2016-06-09 DIAGNOSIS — M25532 Pain in left wrist: Secondary | ICD-10-CM | POA: Diagnosis not present

## 2016-06-09 DIAGNOSIS — M70832 Other soft tissue disorders related to use, overuse and pressure, left forearm: Secondary | ICD-10-CM | POA: Diagnosis not present

## 2016-06-11 DIAGNOSIS — M792 Neuralgia and neuritis, unspecified: Secondary | ICD-10-CM | POA: Diagnosis not present

## 2016-06-11 DIAGNOSIS — I2510 Atherosclerotic heart disease of native coronary artery without angina pectoris: Secondary | ICD-10-CM | POA: Diagnosis not present

## 2016-06-11 DIAGNOSIS — I4891 Unspecified atrial fibrillation: Secondary | ICD-10-CM | POA: Diagnosis not present

## 2016-06-11 DIAGNOSIS — I82622 Acute embolism and thrombosis of deep veins of left upper extremity: Secondary | ICD-10-CM | POA: Diagnosis not present

## 2016-06-11 DIAGNOSIS — Z87891 Personal history of nicotine dependence: Secondary | ICD-10-CM | POA: Diagnosis not present

## 2016-06-11 DIAGNOSIS — M79662 Pain in left lower leg: Secondary | ICD-10-CM | POA: Diagnosis not present

## 2016-06-11 DIAGNOSIS — Z79899 Other long term (current) drug therapy: Secondary | ICD-10-CM | POA: Diagnosis not present

## 2016-06-15 DIAGNOSIS — G4733 Obstructive sleep apnea (adult) (pediatric): Secondary | ICD-10-CM | POA: Diagnosis not present

## 2016-06-16 DIAGNOSIS — E039 Hypothyroidism, unspecified: Secondary | ICD-10-CM | POA: Diagnosis not present

## 2016-06-16 DIAGNOSIS — I480 Paroxysmal atrial fibrillation: Secondary | ICD-10-CM | POA: Diagnosis not present

## 2016-06-16 DIAGNOSIS — I10 Essential (primary) hypertension: Secondary | ICD-10-CM | POA: Diagnosis not present

## 2016-06-16 DIAGNOSIS — E785 Hyperlipidemia, unspecified: Secondary | ICD-10-CM | POA: Diagnosis not present

## 2016-06-25 DIAGNOSIS — M545 Low back pain: Secondary | ICD-10-CM | POA: Diagnosis not present

## 2016-06-25 DIAGNOSIS — J3089 Other allergic rhinitis: Secondary | ICD-10-CM | POA: Diagnosis not present

## 2016-06-25 DIAGNOSIS — I2510 Atherosclerotic heart disease of native coronary artery without angina pectoris: Secondary | ICD-10-CM | POA: Diagnosis not present

## 2016-06-26 DIAGNOSIS — R319 Hematuria, unspecified: Secondary | ICD-10-CM | POA: Diagnosis not present

## 2016-06-26 DIAGNOSIS — I4891 Unspecified atrial fibrillation: Secondary | ICD-10-CM | POA: Diagnosis not present

## 2016-06-29 DIAGNOSIS — R319 Hematuria, unspecified: Secondary | ICD-10-CM | POA: Diagnosis not present

## 2016-06-29 DIAGNOSIS — R6889 Other general symptoms and signs: Secondary | ICD-10-CM | POA: Diagnosis not present

## 2016-06-30 DIAGNOSIS — R39198 Other difficulties with micturition: Secondary | ICD-10-CM | POA: Diagnosis not present

## 2016-06-30 DIAGNOSIS — R3915 Urgency of urination: Secondary | ICD-10-CM | POA: Diagnosis not present

## 2016-06-30 DIAGNOSIS — N2589 Other disorders resulting from impaired renal tubular function: Secondary | ICD-10-CM | POA: Diagnosis not present

## 2016-06-30 DIAGNOSIS — R319 Hematuria, unspecified: Secondary | ICD-10-CM | POA: Diagnosis not present

## 2016-06-30 DIAGNOSIS — R339 Retention of urine, unspecified: Secondary | ICD-10-CM | POA: Diagnosis not present

## 2016-06-30 DIAGNOSIS — Z87448 Personal history of other diseases of urinary system: Secondary | ICD-10-CM | POA: Diagnosis not present

## 2016-06-30 DIAGNOSIS — N3946 Mixed incontinence: Secondary | ICD-10-CM | POA: Diagnosis not present

## 2016-06-30 DIAGNOSIS — R109 Unspecified abdominal pain: Secondary | ICD-10-CM | POA: Diagnosis not present

## 2016-06-30 DIAGNOSIS — R3912 Poor urinary stream: Secondary | ICD-10-CM | POA: Diagnosis not present

## 2016-06-30 DIAGNOSIS — R351 Nocturia: Secondary | ICD-10-CM | POA: Diagnosis not present

## 2016-06-30 DIAGNOSIS — R350 Frequency of micturition: Secondary | ICD-10-CM | POA: Diagnosis not present

## 2016-07-01 DIAGNOSIS — Z6841 Body Mass Index (BMI) 40.0 and over, adult: Secondary | ICD-10-CM | POA: Diagnosis not present

## 2016-07-01 DIAGNOSIS — E063 Autoimmune thyroiditis: Secondary | ICD-10-CM | POA: Diagnosis not present

## 2016-07-01 DIAGNOSIS — E161 Other hypoglycemia: Secondary | ICD-10-CM | POA: Diagnosis not present

## 2016-07-01 DIAGNOSIS — E669 Obesity, unspecified: Secondary | ICD-10-CM | POA: Diagnosis not present

## 2016-07-01 DIAGNOSIS — E038 Other specified hypothyroidism: Secondary | ICD-10-CM | POA: Diagnosis not present

## 2016-07-01 DIAGNOSIS — E559 Vitamin D deficiency, unspecified: Secondary | ICD-10-CM | POA: Diagnosis not present

## 2016-07-01 DIAGNOSIS — D500 Iron deficiency anemia secondary to blood loss (chronic): Secondary | ICD-10-CM | POA: Diagnosis not present

## 2016-07-16 DIAGNOSIS — G4733 Obstructive sleep apnea (adult) (pediatric): Secondary | ICD-10-CM | POA: Diagnosis not present

## 2016-07-28 DIAGNOSIS — M546 Pain in thoracic spine: Secondary | ICD-10-CM | POA: Diagnosis not present

## 2016-07-28 DIAGNOSIS — M545 Low back pain: Secondary | ICD-10-CM | POA: Diagnosis not present

## 2016-07-28 DIAGNOSIS — R7309 Other abnormal glucose: Secondary | ICD-10-CM | POA: Diagnosis not present

## 2016-07-28 DIAGNOSIS — M549 Dorsalgia, unspecified: Secondary | ICD-10-CM | POA: Diagnosis not present

## 2016-07-28 DIAGNOSIS — M542 Cervicalgia: Secondary | ICD-10-CM | POA: Diagnosis not present

## 2016-08-04 DIAGNOSIS — Z87891 Personal history of nicotine dependence: Secondary | ICD-10-CM | POA: Diagnosis not present

## 2016-08-04 DIAGNOSIS — I517 Cardiomegaly: Secondary | ICD-10-CM | POA: Diagnosis not present

## 2016-08-04 DIAGNOSIS — M545 Low back pain: Secondary | ICD-10-CM | POA: Diagnosis not present

## 2016-08-04 DIAGNOSIS — I2510 Atherosclerotic heart disease of native coronary artery without angina pectoris: Secondary | ICD-10-CM | POA: Diagnosis not present

## 2016-08-04 DIAGNOSIS — M79605 Pain in left leg: Secondary | ICD-10-CM | POA: Diagnosis not present

## 2016-08-04 DIAGNOSIS — R0789 Other chest pain: Secondary | ICD-10-CM | POA: Diagnosis not present

## 2016-08-04 DIAGNOSIS — M25512 Pain in left shoulder: Secondary | ICD-10-CM | POA: Diagnosis not present

## 2016-08-04 DIAGNOSIS — I4891 Unspecified atrial fibrillation: Secondary | ICD-10-CM | POA: Diagnosis not present

## 2016-08-04 DIAGNOSIS — E119 Type 2 diabetes mellitus without complications: Secondary | ICD-10-CM | POA: Diagnosis not present

## 2016-08-04 DIAGNOSIS — Z79899 Other long term (current) drug therapy: Secondary | ICD-10-CM | POA: Diagnosis not present

## 2016-08-11 DIAGNOSIS — I10 Essential (primary) hypertension: Secondary | ICD-10-CM | POA: Diagnosis not present

## 2016-08-11 DIAGNOSIS — E119 Type 2 diabetes mellitus without complications: Secondary | ICD-10-CM | POA: Diagnosis not present

## 2016-08-11 DIAGNOSIS — M545 Low back pain: Secondary | ICD-10-CM | POA: Diagnosis not present

## 2016-08-11 DIAGNOSIS — M79605 Pain in left leg: Secondary | ICD-10-CM | POA: Diagnosis not present

## 2016-08-11 DIAGNOSIS — I2510 Atherosclerotic heart disease of native coronary artery without angina pectoris: Secondary | ICD-10-CM | POA: Diagnosis not present

## 2016-08-11 DIAGNOSIS — E039 Hypothyroidism, unspecified: Secondary | ICD-10-CM | POA: Diagnosis not present

## 2016-08-11 DIAGNOSIS — I480 Paroxysmal atrial fibrillation: Secondary | ICD-10-CM | POA: Diagnosis not present

## 2016-08-11 DIAGNOSIS — E785 Hyperlipidemia, unspecified: Secondary | ICD-10-CM | POA: Diagnosis not present

## 2016-08-13 DIAGNOSIS — R609 Edema, unspecified: Secondary | ICD-10-CM | POA: Diagnosis not present

## 2016-08-13 DIAGNOSIS — I890 Lymphedema, not elsewhere classified: Secondary | ICD-10-CM | POA: Diagnosis not present

## 2016-08-15 DIAGNOSIS — G4733 Obstructive sleep apnea (adult) (pediatric): Secondary | ICD-10-CM | POA: Diagnosis not present

## 2016-08-26 DIAGNOSIS — I480 Paroxysmal atrial fibrillation: Secondary | ICD-10-CM | POA: Diagnosis not present

## 2016-08-26 DIAGNOSIS — I2510 Atherosclerotic heart disease of native coronary artery without angina pectoris: Secondary | ICD-10-CM | POA: Diagnosis not present

## 2016-08-26 DIAGNOSIS — Z6841 Body Mass Index (BMI) 40.0 and over, adult: Secondary | ICD-10-CM | POA: Diagnosis not present

## 2016-08-26 DIAGNOSIS — E785 Hyperlipidemia, unspecified: Secondary | ICD-10-CM | POA: Diagnosis not present

## 2016-08-26 DIAGNOSIS — Z7901 Long term (current) use of anticoagulants: Secondary | ICD-10-CM | POA: Diagnosis not present

## 2016-08-26 DIAGNOSIS — E161 Other hypoglycemia: Secondary | ICD-10-CM | POA: Diagnosis not present

## 2016-08-26 DIAGNOSIS — E039 Hypothyroidism, unspecified: Secondary | ICD-10-CM | POA: Diagnosis not present

## 2016-08-31 DIAGNOSIS — M5442 Lumbago with sciatica, left side: Secondary | ICD-10-CM | POA: Diagnosis not present

## 2016-08-31 DIAGNOSIS — Y9389 Activity, other specified: Secondary | ICD-10-CM | POA: Diagnosis not present

## 2016-08-31 DIAGNOSIS — Z87891 Personal history of nicotine dependence: Secondary | ICD-10-CM | POA: Diagnosis not present

## 2016-08-31 DIAGNOSIS — Z79899 Other long term (current) drug therapy: Secondary | ICD-10-CM | POA: Diagnosis not present

## 2016-08-31 DIAGNOSIS — I2510 Atherosclerotic heart disease of native coronary artery without angina pectoris: Secondary | ICD-10-CM | POA: Diagnosis not present

## 2016-08-31 DIAGNOSIS — Y92010 Kitchen of single-family (private) house as the place of occurrence of the external cause: Secondary | ICD-10-CM | POA: Diagnosis not present

## 2016-08-31 DIAGNOSIS — M545 Low back pain: Secondary | ICD-10-CM | POA: Diagnosis not present

## 2016-08-31 DIAGNOSIS — W07XXXA Fall from chair, initial encounter: Secondary | ICD-10-CM | POA: Diagnosis not present

## 2016-08-31 DIAGNOSIS — I4891 Unspecified atrial fibrillation: Secondary | ICD-10-CM | POA: Diagnosis not present

## 2016-08-31 DIAGNOSIS — M5416 Radiculopathy, lumbar region: Secondary | ICD-10-CM | POA: Diagnosis not present

## 2016-09-01 DIAGNOSIS — Z5181 Encounter for therapeutic drug level monitoring: Secondary | ICD-10-CM | POA: Diagnosis not present

## 2016-09-01 DIAGNOSIS — I2510 Atherosclerotic heart disease of native coronary artery without angina pectoris: Secondary | ICD-10-CM | POA: Diagnosis not present

## 2016-09-01 DIAGNOSIS — M545 Low back pain: Secondary | ICD-10-CM | POA: Diagnosis not present

## 2016-09-01 DIAGNOSIS — Z79899 Other long term (current) drug therapy: Secondary | ICD-10-CM | POA: Diagnosis not present

## 2016-09-01 DIAGNOSIS — M79605 Pain in left leg: Secondary | ICD-10-CM | POA: Diagnosis not present

## 2016-09-01 DIAGNOSIS — E119 Type 2 diabetes mellitus without complications: Secondary | ICD-10-CM | POA: Diagnosis not present

## 2016-09-04 DIAGNOSIS — W19XXXA Unspecified fall, initial encounter: Secondary | ICD-10-CM | POA: Diagnosis not present

## 2016-09-04 DIAGNOSIS — Y92009 Unspecified place in unspecified non-institutional (private) residence as the place of occurrence of the external cause: Secondary | ICD-10-CM | POA: Diagnosis not present

## 2016-09-04 DIAGNOSIS — M25552 Pain in left hip: Secondary | ICD-10-CM | POA: Diagnosis not present

## 2016-09-04 DIAGNOSIS — Z6841 Body Mass Index (BMI) 40.0 and over, adult: Secondary | ICD-10-CM | POA: Diagnosis not present

## 2016-09-04 DIAGNOSIS — M545 Low back pain: Secondary | ICD-10-CM | POA: Diagnosis not present

## 2016-09-08 DIAGNOSIS — I2510 Atherosclerotic heart disease of native coronary artery without angina pectoris: Secondary | ICD-10-CM | POA: Diagnosis not present

## 2016-09-08 DIAGNOSIS — E119 Type 2 diabetes mellitus without complications: Secondary | ICD-10-CM | POA: Diagnosis not present

## 2016-09-08 DIAGNOSIS — M545 Low back pain: Secondary | ICD-10-CM | POA: Diagnosis not present

## 2016-09-08 DIAGNOSIS — M79605 Pain in left leg: Secondary | ICD-10-CM | POA: Diagnosis not present

## 2016-09-15 DIAGNOSIS — G4733 Obstructive sleep apnea (adult) (pediatric): Secondary | ICD-10-CM | POA: Diagnosis not present

## 2016-09-24 DIAGNOSIS — J9811 Atelectasis: Secondary | ICD-10-CM | POA: Diagnosis not present

## 2016-09-24 DIAGNOSIS — R5383 Other fatigue: Secondary | ICD-10-CM | POA: Diagnosis not present

## 2016-09-24 DIAGNOSIS — T673XXA Heat exhaustion, anhydrotic, initial encounter: Secondary | ICD-10-CM | POA: Diagnosis not present

## 2016-09-30 DIAGNOSIS — R5381 Other malaise: Secondary | ICD-10-CM | POA: Diagnosis not present

## 2016-09-30 DIAGNOSIS — I480 Paroxysmal atrial fibrillation: Secondary | ICD-10-CM | POA: Diagnosis not present

## 2016-09-30 DIAGNOSIS — Z9989 Dependence on other enabling machines and devices: Secondary | ICD-10-CM | POA: Diagnosis not present

## 2016-09-30 DIAGNOSIS — Z6841 Body Mass Index (BMI) 40.0 and over, adult: Secondary | ICD-10-CM | POA: Diagnosis not present

## 2016-09-30 DIAGNOSIS — R5383 Other fatigue: Secondary | ICD-10-CM | POA: Diagnosis not present

## 2016-09-30 DIAGNOSIS — G4733 Obstructive sleep apnea (adult) (pediatric): Secondary | ICD-10-CM | POA: Diagnosis not present

## 2016-10-05 DIAGNOSIS — Z87891 Personal history of nicotine dependence: Secondary | ICD-10-CM | POA: Diagnosis not present

## 2016-10-05 DIAGNOSIS — R0789 Other chest pain: Secondary | ICD-10-CM | POA: Diagnosis not present

## 2016-10-05 DIAGNOSIS — I4891 Unspecified atrial fibrillation: Secondary | ICD-10-CM | POA: Diagnosis not present

## 2016-10-05 DIAGNOSIS — R531 Weakness: Secondary | ICD-10-CM | POA: Diagnosis not present

## 2016-10-05 DIAGNOSIS — Z79899 Other long term (current) drug therapy: Secondary | ICD-10-CM | POA: Diagnosis not present

## 2016-10-05 DIAGNOSIS — I2510 Atherosclerotic heart disease of native coronary artery without angina pectoris: Secondary | ICD-10-CM | POA: Diagnosis not present

## 2016-10-06 DIAGNOSIS — Z630 Problems in relationship with spouse or partner: Secondary | ICD-10-CM | POA: Diagnosis not present

## 2016-10-06 DIAGNOSIS — Z6841 Body Mass Index (BMI) 40.0 and over, adult: Secondary | ICD-10-CM | POA: Diagnosis not present

## 2016-10-06 DIAGNOSIS — R531 Weakness: Secondary | ICD-10-CM | POA: Diagnosis not present

## 2016-10-06 DIAGNOSIS — E039 Hypothyroidism, unspecified: Secondary | ICD-10-CM | POA: Diagnosis not present

## 2016-10-06 DIAGNOSIS — R5383 Other fatigue: Secondary | ICD-10-CM | POA: Diagnosis not present

## 2016-10-06 DIAGNOSIS — K219 Gastro-esophageal reflux disease without esophagitis: Secondary | ICD-10-CM | POA: Diagnosis not present

## 2016-10-06 DIAGNOSIS — F411 Generalized anxiety disorder: Secondary | ICD-10-CM | POA: Diagnosis not present

## 2016-10-06 DIAGNOSIS — I2510 Atherosclerotic heart disease of native coronary artery without angina pectoris: Secondary | ICD-10-CM | POA: Diagnosis not present

## 2016-10-08 DIAGNOSIS — I2510 Atherosclerotic heart disease of native coronary artery without angina pectoris: Secondary | ICD-10-CM | POA: Diagnosis not present

## 2016-10-08 DIAGNOSIS — R531 Weakness: Secondary | ICD-10-CM | POA: Diagnosis not present

## 2016-10-08 DIAGNOSIS — R5383 Other fatigue: Secondary | ICD-10-CM | POA: Diagnosis not present

## 2016-10-08 DIAGNOSIS — K219 Gastro-esophageal reflux disease without esophagitis: Secondary | ICD-10-CM | POA: Diagnosis not present

## 2016-10-08 DIAGNOSIS — E039 Hypothyroidism, unspecified: Secondary | ICD-10-CM | POA: Diagnosis not present

## 2016-10-09 DIAGNOSIS — I2510 Atherosclerotic heart disease of native coronary artery without angina pectoris: Secondary | ICD-10-CM | POA: Diagnosis not present

## 2016-10-09 DIAGNOSIS — F411 Generalized anxiety disorder: Secondary | ICD-10-CM | POA: Diagnosis not present

## 2016-10-09 DIAGNOSIS — Z6841 Body Mass Index (BMI) 40.0 and over, adult: Secondary | ICD-10-CM | POA: Diagnosis not present

## 2016-10-14 DIAGNOSIS — I2510 Atherosclerotic heart disease of native coronary artery without angina pectoris: Secondary | ICD-10-CM | POA: Diagnosis not present

## 2016-10-14 DIAGNOSIS — I480 Paroxysmal atrial fibrillation: Secondary | ICD-10-CM | POA: Diagnosis not present

## 2016-10-14 DIAGNOSIS — Z79899 Other long term (current) drug therapy: Secondary | ICD-10-CM | POA: Diagnosis not present

## 2016-10-14 DIAGNOSIS — R531 Weakness: Secondary | ICD-10-CM | POA: Diagnosis not present

## 2016-10-14 DIAGNOSIS — Z7901 Long term (current) use of anticoagulants: Secondary | ICD-10-CM | POA: Diagnosis not present

## 2016-10-14 DIAGNOSIS — E039 Hypothyroidism, unspecified: Secondary | ICD-10-CM | POA: Diagnosis not present

## 2016-10-14 DIAGNOSIS — R5383 Other fatigue: Secondary | ICD-10-CM | POA: Diagnosis not present

## 2016-10-15 DIAGNOSIS — G4733 Obstructive sleep apnea (adult) (pediatric): Secondary | ICD-10-CM | POA: Diagnosis not present

## 2016-10-20 DIAGNOSIS — R5383 Other fatigue: Secondary | ICD-10-CM | POA: Diagnosis not present

## 2016-10-20 DIAGNOSIS — H0012 Chalazion right lower eyelid: Secondary | ICD-10-CM | POA: Diagnosis not present

## 2016-10-20 DIAGNOSIS — I2510 Atherosclerotic heart disease of native coronary artery without angina pectoris: Secondary | ICD-10-CM | POA: Diagnosis not present

## 2016-10-20 DIAGNOSIS — I480 Paroxysmal atrial fibrillation: Secondary | ICD-10-CM | POA: Diagnosis not present

## 2016-10-27 DIAGNOSIS — E039 Hypothyroidism, unspecified: Secondary | ICD-10-CM | POA: Diagnosis not present

## 2016-10-27 DIAGNOSIS — E785 Hyperlipidemia, unspecified: Secondary | ICD-10-CM | POA: Diagnosis not present

## 2016-10-27 DIAGNOSIS — I480 Paroxysmal atrial fibrillation: Secondary | ICD-10-CM | POA: Diagnosis not present

## 2016-10-27 DIAGNOSIS — I429 Cardiomyopathy, unspecified: Secondary | ICD-10-CM | POA: Diagnosis not present

## 2016-10-27 DIAGNOSIS — I10 Essential (primary) hypertension: Secondary | ICD-10-CM | POA: Diagnosis not present

## 2016-10-28 DIAGNOSIS — I493 Ventricular premature depolarization: Secondary | ICD-10-CM | POA: Diagnosis not present

## 2016-10-28 DIAGNOSIS — R5383 Other fatigue: Secondary | ICD-10-CM | POA: Diagnosis not present

## 2016-11-03 DIAGNOSIS — Z87820 Personal history of traumatic brain injury: Secondary | ICD-10-CM | POA: Diagnosis not present

## 2016-11-03 DIAGNOSIS — D500 Iron deficiency anemia secondary to blood loss (chronic): Secondary | ICD-10-CM | POA: Diagnosis not present

## 2016-11-03 DIAGNOSIS — R5382 Chronic fatigue, unspecified: Secondary | ICD-10-CM | POA: Diagnosis not present

## 2016-11-03 DIAGNOSIS — N951 Menopausal and female climacteric states: Secondary | ICD-10-CM | POA: Diagnosis not present

## 2016-11-03 DIAGNOSIS — S060X1S Concussion with loss of consciousness of 30 minutes or less, sequela: Secondary | ICD-10-CM | POA: Diagnosis not present

## 2016-11-03 DIAGNOSIS — Z8679 Personal history of other diseases of the circulatory system: Secondary | ICD-10-CM | POA: Diagnosis not present

## 2016-11-03 DIAGNOSIS — E063 Autoimmune thyroiditis: Secondary | ICD-10-CM | POA: Diagnosis not present

## 2016-11-03 DIAGNOSIS — Z6841 Body Mass Index (BMI) 40.0 and over, adult: Secondary | ICD-10-CM | POA: Diagnosis not present

## 2016-11-03 DIAGNOSIS — E161 Other hypoglycemia: Secondary | ICD-10-CM | POA: Diagnosis not present

## 2016-11-03 DIAGNOSIS — E038 Other specified hypothyroidism: Secondary | ICD-10-CM | POA: Diagnosis not present

## 2016-11-06 DIAGNOSIS — R799 Abnormal finding of blood chemistry, unspecified: Secondary | ICD-10-CM | POA: Diagnosis not present

## 2016-11-06 DIAGNOSIS — E038 Other specified hypothyroidism: Secondary | ICD-10-CM | POA: Diagnosis not present

## 2016-11-06 DIAGNOSIS — Z6841 Body Mass Index (BMI) 40.0 and over, adult: Secondary | ICD-10-CM | POA: Diagnosis not present

## 2016-11-06 DIAGNOSIS — E063 Autoimmune thyroiditis: Secondary | ICD-10-CM | POA: Diagnosis not present

## 2016-11-06 DIAGNOSIS — I2510 Atherosclerotic heart disease of native coronary artery without angina pectoris: Secondary | ICD-10-CM | POA: Diagnosis not present

## 2016-11-06 DIAGNOSIS — R601 Generalized edema: Secondary | ICD-10-CM | POA: Diagnosis not present

## 2016-11-10 DIAGNOSIS — E039 Hypothyroidism, unspecified: Secondary | ICD-10-CM | POA: Diagnosis not present

## 2016-11-10 DIAGNOSIS — I480 Paroxysmal atrial fibrillation: Secondary | ICD-10-CM | POA: Diagnosis not present

## 2016-11-10 DIAGNOSIS — I10 Essential (primary) hypertension: Secondary | ICD-10-CM | POA: Diagnosis not present

## 2016-11-15 DIAGNOSIS — G4733 Obstructive sleep apnea (adult) (pediatric): Secondary | ICD-10-CM | POA: Diagnosis not present

## 2016-11-25 DIAGNOSIS — H31091 Other chorioretinal scars, right eye: Secondary | ICD-10-CM | POA: Diagnosis not present

## 2016-11-25 DIAGNOSIS — Z8619 Personal history of other infectious and parasitic diseases: Secondary | ICD-10-CM | POA: Diagnosis not present

## 2016-11-25 DIAGNOSIS — H2513 Age-related nuclear cataract, bilateral: Secondary | ICD-10-CM | POA: Diagnosis not present

## 2016-11-25 DIAGNOSIS — H353131 Nonexudative age-related macular degeneration, bilateral, early dry stage: Secondary | ICD-10-CM | POA: Diagnosis not present

## 2016-11-26 DIAGNOSIS — Z1213 Encounter for screening for malignant neoplasm of small intestine: Secondary | ICD-10-CM | POA: Diagnosis not present

## 2016-11-26 DIAGNOSIS — Z1231 Encounter for screening mammogram for malignant neoplasm of breast: Secondary | ICD-10-CM | POA: Diagnosis not present

## 2016-11-26 DIAGNOSIS — Z0000 Encounter for general adult medical examination without abnormal findings: Secondary | ICD-10-CM | POA: Diagnosis not present

## 2016-11-30 DIAGNOSIS — Z7689 Persons encountering health services in other specified circumstances: Secondary | ICD-10-CM | POA: Diagnosis not present

## 2016-11-30 DIAGNOSIS — F419 Anxiety disorder, unspecified: Secondary | ICD-10-CM | POA: Diagnosis not present

## 2016-11-30 DIAGNOSIS — Z8679 Personal history of other diseases of the circulatory system: Secondary | ICD-10-CM | POA: Diagnosis not present

## 2016-11-30 DIAGNOSIS — E875 Hyperkalemia: Secondary | ICD-10-CM | POA: Diagnosis not present

## 2016-11-30 DIAGNOSIS — I480 Paroxysmal atrial fibrillation: Secondary | ICD-10-CM | POA: Diagnosis not present

## 2016-11-30 DIAGNOSIS — Z6841 Body Mass Index (BMI) 40.0 and over, adult: Secondary | ICD-10-CM | POA: Diagnosis not present

## 2016-11-30 DIAGNOSIS — N950 Postmenopausal bleeding: Secondary | ICD-10-CM | POA: Diagnosis not present

## 2016-11-30 DIAGNOSIS — K219 Gastro-esophageal reflux disease without esophagitis: Secondary | ICD-10-CM | POA: Diagnosis not present

## 2016-11-30 DIAGNOSIS — G4733 Obstructive sleep apnea (adult) (pediatric): Secondary | ICD-10-CM | POA: Diagnosis not present

## 2016-12-15 DIAGNOSIS — E785 Hyperlipidemia, unspecified: Secondary | ICD-10-CM | POA: Diagnosis not present

## 2016-12-15 DIAGNOSIS — Z6841 Body Mass Index (BMI) 40.0 and over, adult: Secondary | ICD-10-CM | POA: Diagnosis not present

## 2016-12-15 DIAGNOSIS — I480 Paroxysmal atrial fibrillation: Secondary | ICD-10-CM | POA: Diagnosis not present

## 2016-12-16 DIAGNOSIS — G4733 Obstructive sleep apnea (adult) (pediatric): Secondary | ICD-10-CM | POA: Diagnosis not present

## 2016-12-28 DIAGNOSIS — Z6841 Body Mass Index (BMI) 40.0 and over, adult: Secondary | ICD-10-CM | POA: Diagnosis not present

## 2016-12-28 DIAGNOSIS — E038 Other specified hypothyroidism: Secondary | ICD-10-CM | POA: Diagnosis not present

## 2016-12-28 DIAGNOSIS — E063 Autoimmune thyroiditis: Secondary | ICD-10-CM | POA: Diagnosis not present

## 2016-12-28 DIAGNOSIS — R310 Gross hematuria: Secondary | ICD-10-CM | POA: Diagnosis not present

## 2016-12-29 DIAGNOSIS — M25531 Pain in right wrist: Secondary | ICD-10-CM | POA: Diagnosis not present

## 2016-12-29 DIAGNOSIS — Z7902 Long term (current) use of antithrombotics/antiplatelets: Secondary | ICD-10-CM | POA: Diagnosis not present

## 2016-12-29 DIAGNOSIS — J302 Other seasonal allergic rhinitis: Secondary | ICD-10-CM | POA: Diagnosis not present

## 2016-12-30 DIAGNOSIS — R935 Abnormal findings on diagnostic imaging of other abdominal regions, including retroperitoneum: Secondary | ICD-10-CM | POA: Diagnosis not present

## 2016-12-30 DIAGNOSIS — Z8742 Personal history of other diseases of the female genital tract: Secondary | ICD-10-CM | POA: Diagnosis not present

## 2017-01-01 IMAGING — DX DG CHEST 1V PORT
1 series · 1 of 1 positions shown · non-contrast
Comparison: Chest radiograph performed 09/21/2015

CLINICAL DATA: Acute onset of generalized chest tightness and mild
shortness of breath. Initial encounter.

EXAM:
PORTABLE CHEST 1 VIEW

[chest ap]
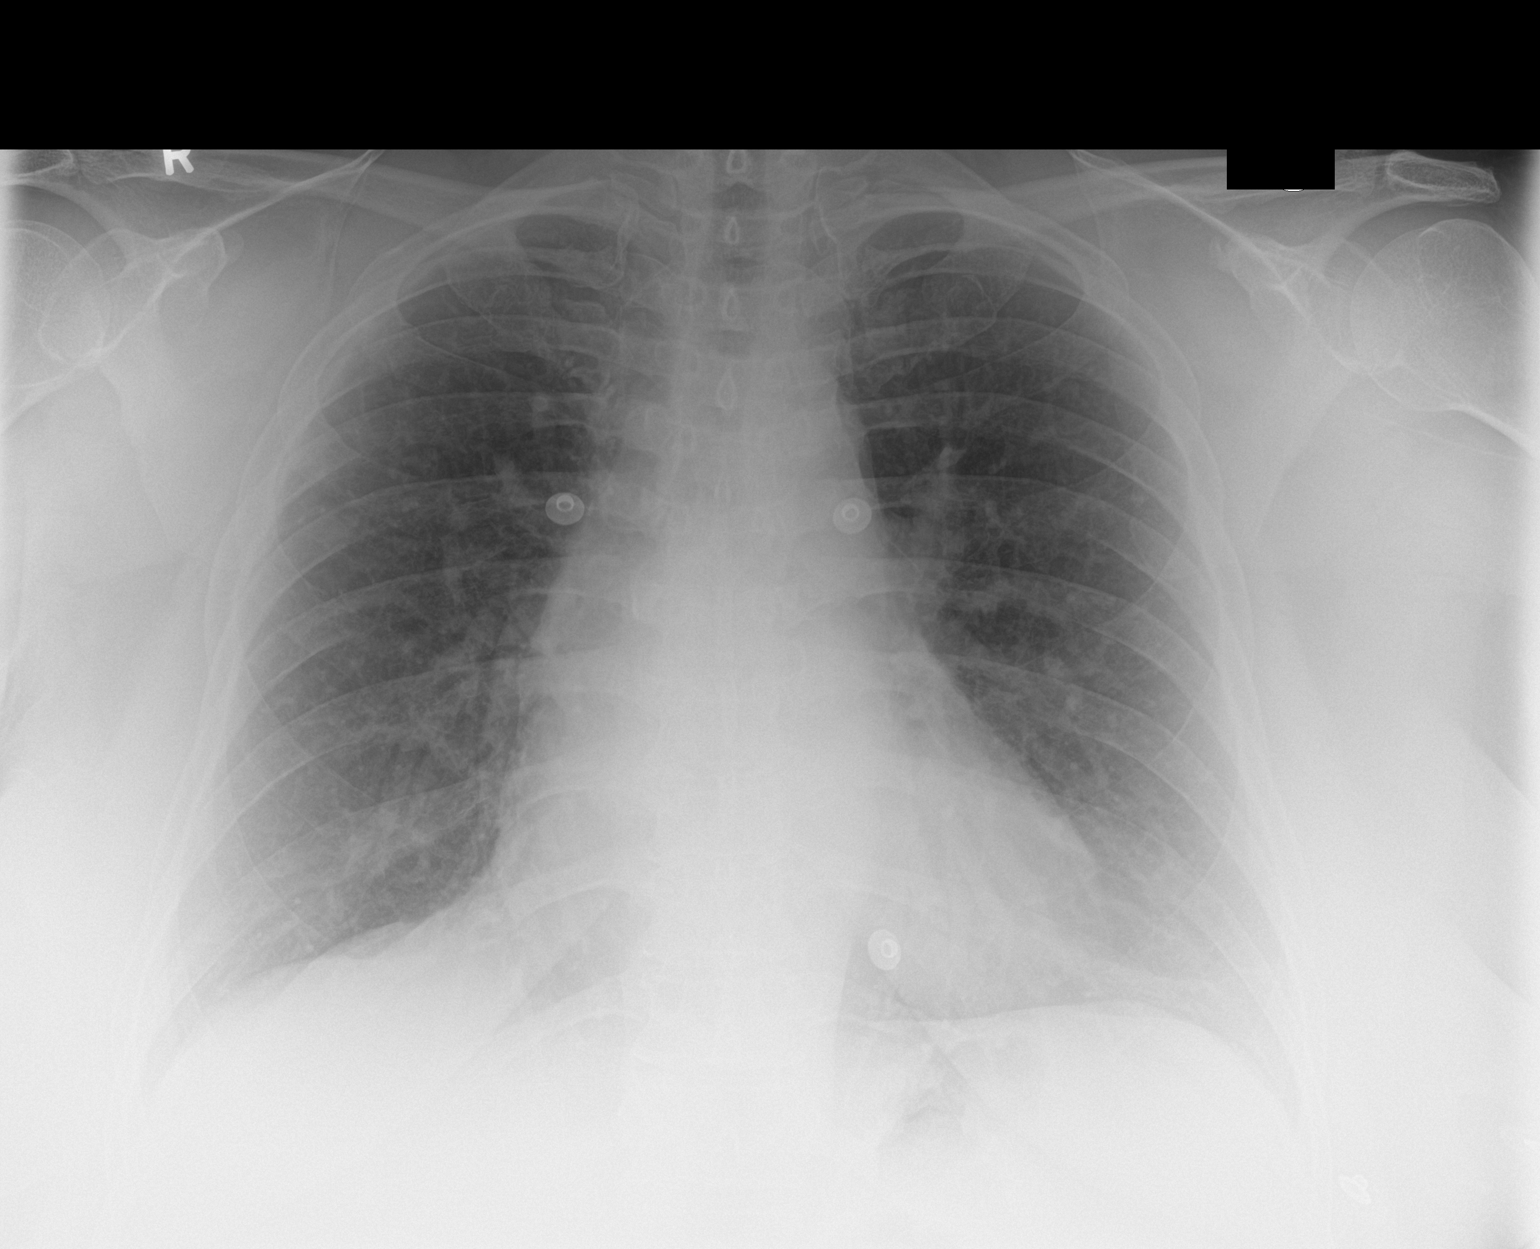

[1 of 1 positions shown; findings below may reference images not displayed]

FINDINGS: The lungs are well-aerated. Mild vascular congestion is noted. There
is no evidence of focal opacification, pleural effusion or
pneumothorax.

The cardiomediastinal silhouette is within normal limits. No acute
osseous abnormalities are seen.
IMPRESSION: Mild vascular congestion noted.  Lungs remain grossly clear.

## 2017-01-06 DIAGNOSIS — R350 Frequency of micturition: Secondary | ICD-10-CM | POA: Diagnosis not present

## 2017-01-06 DIAGNOSIS — Z87448 Personal history of other diseases of urinary system: Secondary | ICD-10-CM | POA: Diagnosis not present

## 2017-01-06 DIAGNOSIS — N3946 Mixed incontinence: Secondary | ICD-10-CM | POA: Diagnosis not present

## 2017-01-06 DIAGNOSIS — R3912 Poor urinary stream: Secondary | ICD-10-CM | POA: Diagnosis not present

## 2017-01-06 DIAGNOSIS — R319 Hematuria, unspecified: Secondary | ICD-10-CM | POA: Diagnosis not present

## 2017-01-06 DIAGNOSIS — R351 Nocturia: Secondary | ICD-10-CM | POA: Diagnosis not present

## 2017-01-06 DIAGNOSIS — R3915 Urgency of urination: Secondary | ICD-10-CM | POA: Diagnosis not present

## 2017-01-06 DIAGNOSIS — N2589 Other disorders resulting from impaired renal tubular function: Secondary | ICD-10-CM | POA: Diagnosis not present

## 2017-01-06 DIAGNOSIS — R3989 Other symptoms and signs involving the genitourinary system: Secondary | ICD-10-CM | POA: Diagnosis not present

## 2017-01-06 DIAGNOSIS — R338 Other retention of urine: Secondary | ICD-10-CM | POA: Diagnosis not present

## 2017-01-06 DIAGNOSIS — Z87891 Personal history of nicotine dependence: Secondary | ICD-10-CM | POA: Diagnosis not present

## 2017-01-06 DIAGNOSIS — R39198 Other difficulties with micturition: Secondary | ICD-10-CM | POA: Diagnosis not present

## 2017-01-11 DIAGNOSIS — R29898 Other symptoms and signs involving the musculoskeletal system: Secondary | ICD-10-CM | POA: Diagnosis not present

## 2017-01-11 DIAGNOSIS — M25631 Stiffness of right wrist, not elsewhere classified: Secondary | ICD-10-CM | POA: Diagnosis not present

## 2017-01-11 DIAGNOSIS — M25531 Pain in right wrist: Secondary | ICD-10-CM | POA: Diagnosis not present

## 2017-01-11 DIAGNOSIS — Z9889 Other specified postprocedural states: Secondary | ICD-10-CM | POA: Diagnosis not present

## 2017-01-11 DIAGNOSIS — E119 Type 2 diabetes mellitus without complications: Secondary | ICD-10-CM | POA: Diagnosis not present

## 2017-01-15 DIAGNOSIS — G4733 Obstructive sleep apnea (adult) (pediatric): Secondary | ICD-10-CM | POA: Diagnosis not present

## 2017-01-16 IMAGING — CT CT ABD-PELV W/O CM
2 of 4 series · 16 of 46 positions shown, 18 images · non-contrast
Comparison: None.

CLINICAL DATA: Epigastric, right, and left upper quadrant pain.

EXAM:
CT ABDOMEN AND PELVIS WITHOUT CONTRAST
TECHNIQUE: Multidetector CT imaging of the abdomen and pelvis was performed
following the standard protocol without IV contrast.

[Series 2: routine abd pel wo · axial · 0.97mm/px · z∈[-558,-84]mm · 13 of 105 slices shown, 15 images]
[im 5/105  soft-tissue]
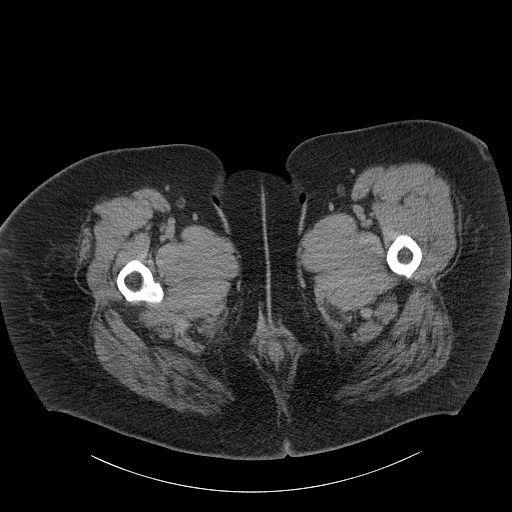
[im 5/105  bone]
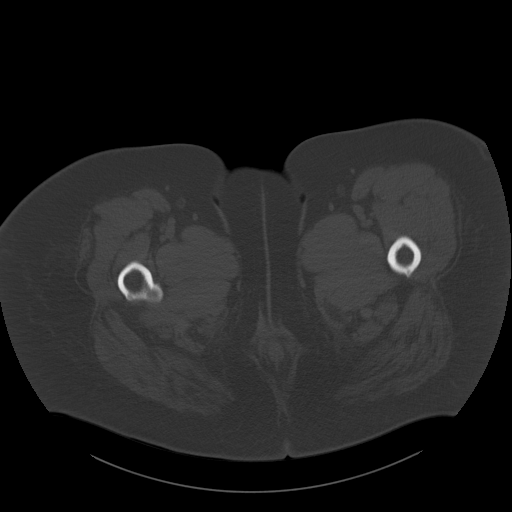
[im 14/105  soft-tissue]
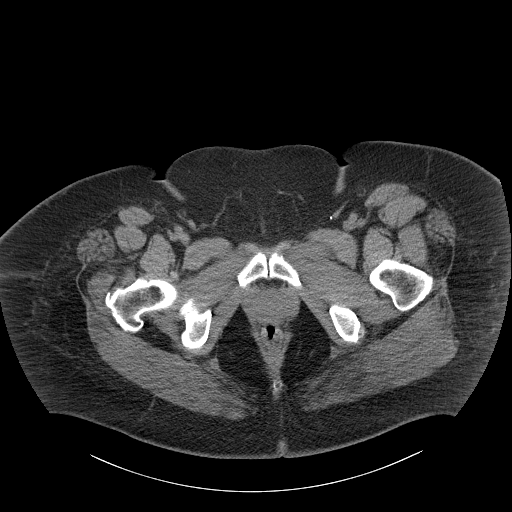
[im 23/105  soft-tissue]
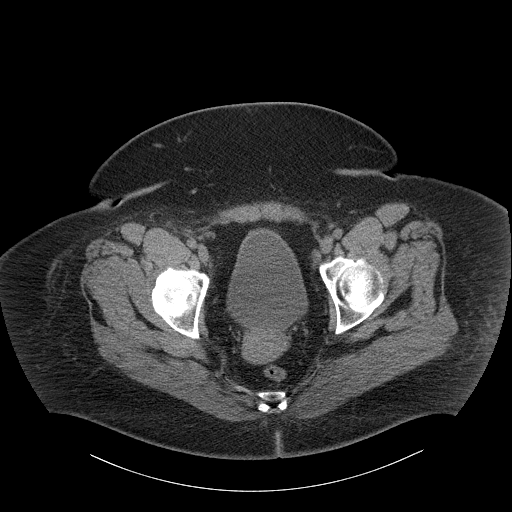
[im 28/105  soft-tissue]
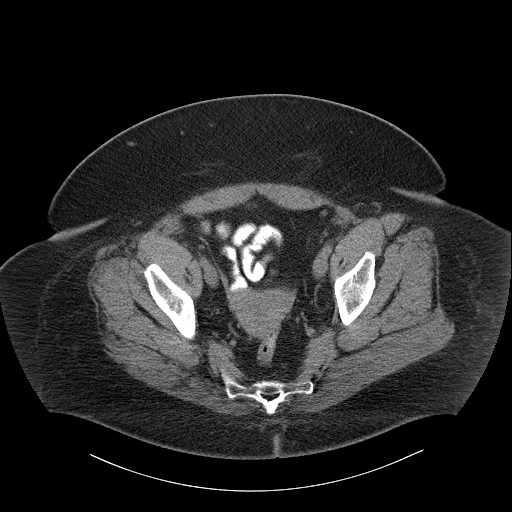
[im 37/105  soft-tissue]
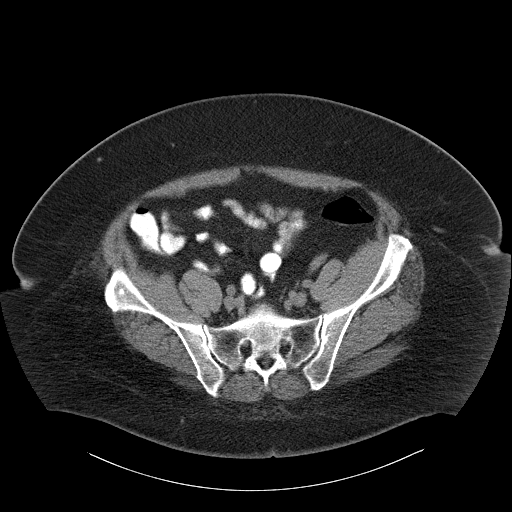
[im 46/105  soft-tissue]
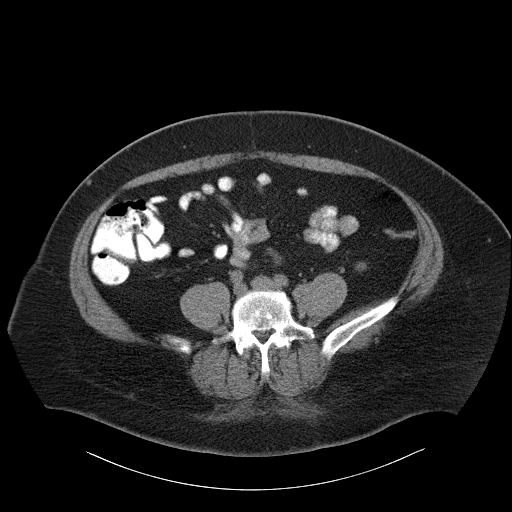
[im 55/105  soft-tissue]
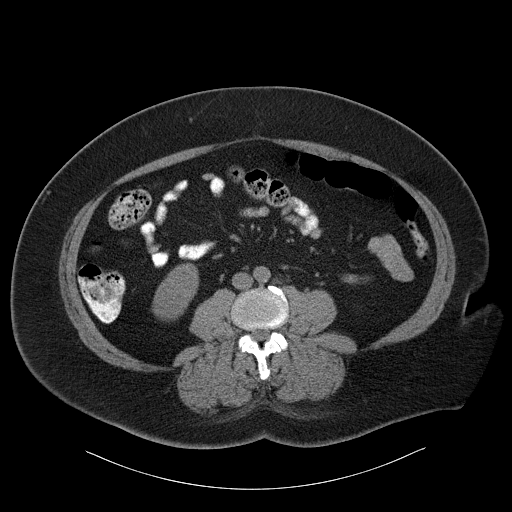
[im 59/105  soft-tissue]
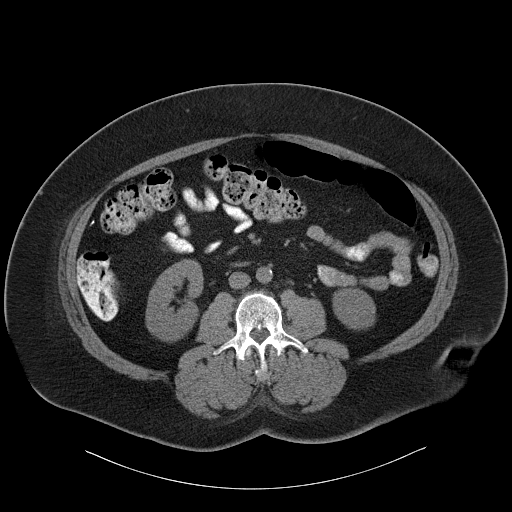
[im 68/105  soft-tissue]
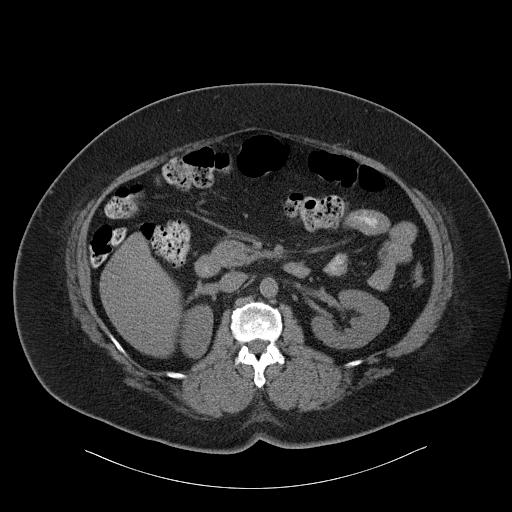
[im 68/105  bone]
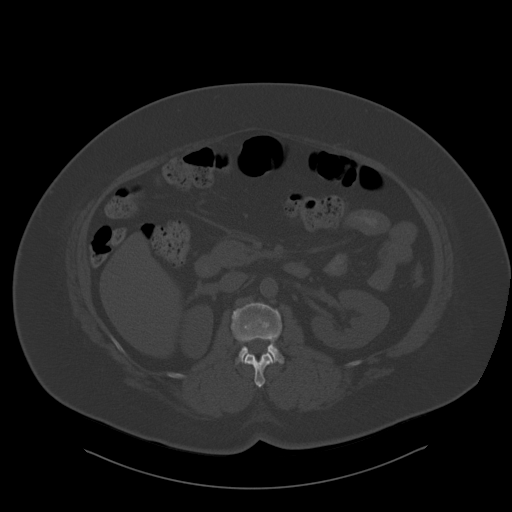
[im 77/105  soft-tissue]
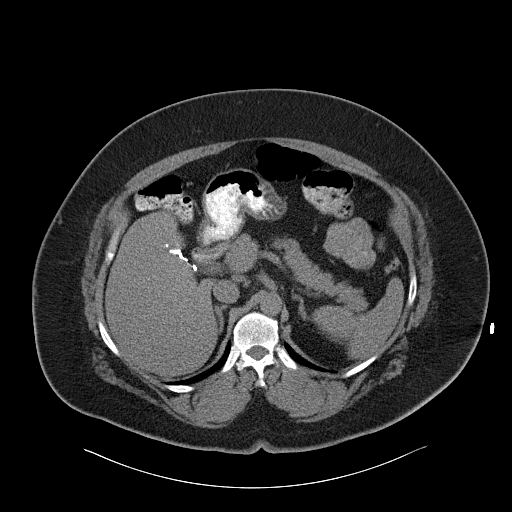
[im 82/105  soft-tissue]
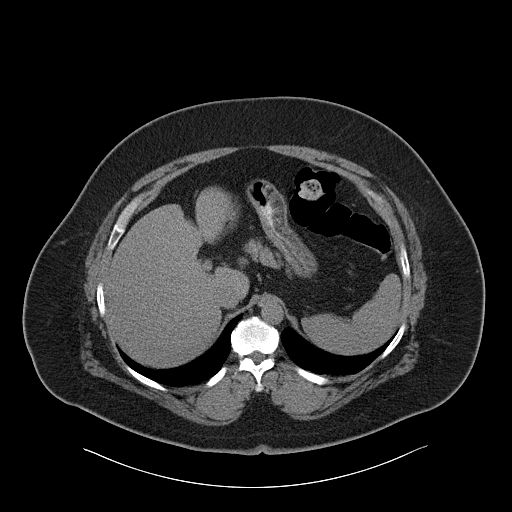
[im 91/105  soft-tissue]
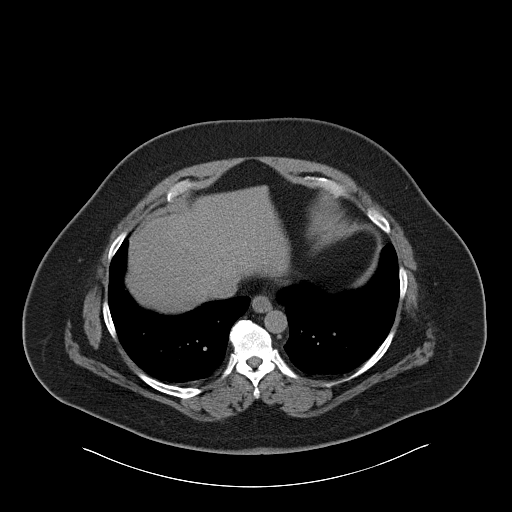
[im 100/105  soft-tissue]
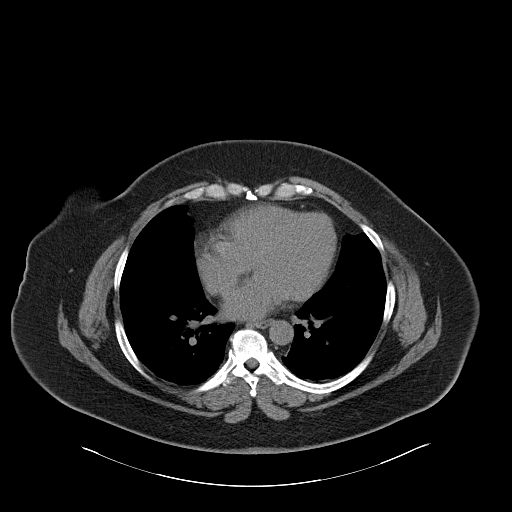

[Series 5: cor routine abd pel wo · coronal · 0.98mm/px · 3 of 172 slices shown]
[im 58/172  soft-tissue]
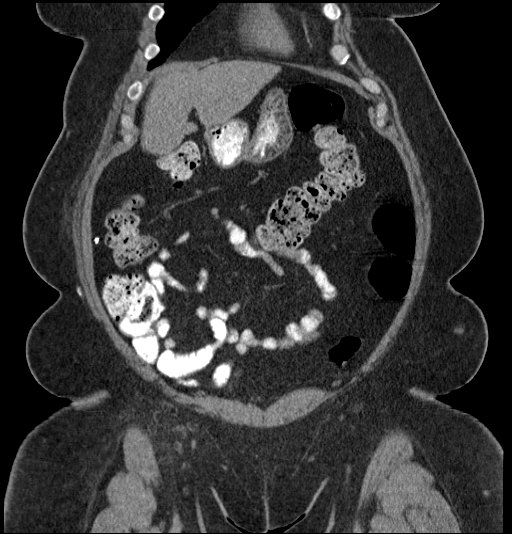
[im 77/172  soft-tissue]
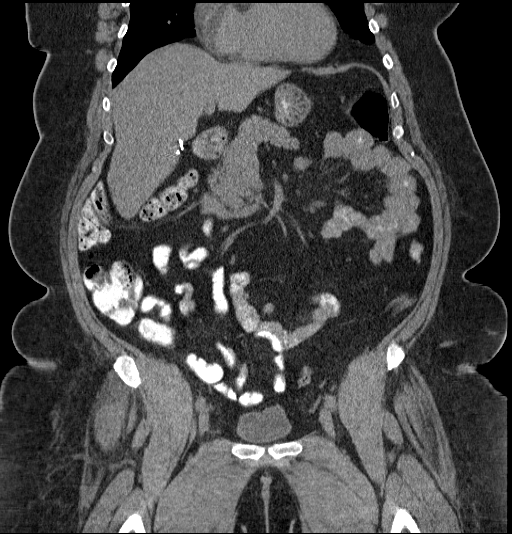
[im 96/172  soft-tissue]
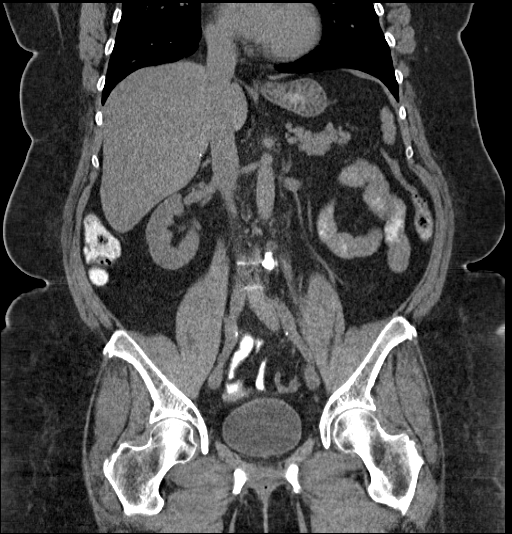

[16 of 46 positions shown; findings below may reference images not displayed]

FINDINGS: Lower chest: Tiny pleural-based nodules versus pleural thickening in
the lower left hemithorax. No pleural effusion.

Liver: Borderline hepatic steatosis. No evidence of focal lesion
allowing for lack contrast.

Hepatobiliary: Clips in the gallbladder fossa postcholecystectomy.
Prominent common bile duct measuring 9 mm at the porta hepatis,
likely sequela of cholecystectomy.

Pancreas: No ductal dilatation or inflammation.

Spleen: Normal.

Adrenal glands: No nodule.

Kidneys: Symmetric in size without stones or hydronephrosis. There
is no perinephric stranding. Both ureters are decompressed without
stones along the course.

Stomach/Bowel: Small hiatal hernia. Stomach physiologically
distended. There are no dilated or thickened small bowel loops.
Moderate volume of stool in the right and transverse colon, more
distal colon is decompressed. Descending and sigmoid colon
redundancy. No colonic wall thickening or inflammation. The appendix
is surgically absent.

Vascular/Lymphatic: Small retroperitoneal and central mesenteric
nodes, not enlarged by size criteria. Abdominal aorta is normal in
caliber. Trace atherosclerosis.

Reproductive: The uterus is normal in size. Left ovary normal in
size. Right ovary surgically absent. No adnexal mass.

Bladder: Physiologically distended without wall thickening.

Other: No free air, free fluid, or intra-abdominal fluid collection.

Musculoskeletal: There are no acute or suspicious osseous
abnormalities.
IMPRESSION: 1. No acute abnormality in the abdomen/pelvis.
2. Small hiatal hernia.
3. Postcholecystectomy. Extrahepatic biliary prominence likely
secondary to prior cholecystectomy.

## 2017-01-19 DIAGNOSIS — I480 Paroxysmal atrial fibrillation: Secondary | ICD-10-CM | POA: Diagnosis not present

## 2017-01-19 DIAGNOSIS — E119 Type 2 diabetes mellitus without complications: Secondary | ICD-10-CM | POA: Diagnosis not present

## 2017-01-19 DIAGNOSIS — M25531 Pain in right wrist: Secondary | ICD-10-CM | POA: Diagnosis not present

## 2017-01-19 DIAGNOSIS — E785 Hyperlipidemia, unspecified: Secondary | ICD-10-CM | POA: Diagnosis not present

## 2017-01-19 DIAGNOSIS — I10 Essential (primary) hypertension: Secondary | ICD-10-CM | POA: Diagnosis not present

## 2017-01-19 DIAGNOSIS — R29898 Other symptoms and signs involving the musculoskeletal system: Secondary | ICD-10-CM | POA: Diagnosis not present

## 2017-01-19 DIAGNOSIS — Z9889 Other specified postprocedural states: Secondary | ICD-10-CM | POA: Diagnosis not present

## 2017-01-19 DIAGNOSIS — M25631 Stiffness of right wrist, not elsewhere classified: Secondary | ICD-10-CM | POA: Diagnosis not present

## 2017-01-20 DIAGNOSIS — F411 Generalized anxiety disorder: Secondary | ICD-10-CM | POA: Diagnosis not present

## 2017-01-20 DIAGNOSIS — B372 Candidiasis of skin and nail: Secondary | ICD-10-CM | POA: Diagnosis not present

## 2017-01-20 DIAGNOSIS — I480 Paroxysmal atrial fibrillation: Secondary | ICD-10-CM | POA: Diagnosis not present

## 2017-01-22 DIAGNOSIS — E119 Type 2 diabetes mellitus without complications: Secondary | ICD-10-CM | POA: Diagnosis not present

## 2017-01-22 DIAGNOSIS — M25631 Stiffness of right wrist, not elsewhere classified: Secondary | ICD-10-CM | POA: Diagnosis not present

## 2017-01-22 DIAGNOSIS — Z9889 Other specified postprocedural states: Secondary | ICD-10-CM | POA: Diagnosis not present

## 2017-01-22 DIAGNOSIS — R29898 Other symptoms and signs involving the musculoskeletal system: Secondary | ICD-10-CM | POA: Diagnosis not present

## 2017-01-22 DIAGNOSIS — M25531 Pain in right wrist: Secondary | ICD-10-CM | POA: Diagnosis not present

## 2017-01-27 ENCOUNTER — Other Ambulatory Visit: Payer: Self-pay | Admitting: *Deleted

## 2017-01-27 NOTE — Patient Outreach (Signed)
[facility]) Care Management  01/27/2017  Cicley Main 1961/04/06 [id number]  Self referral from patient. Patient was transferred from [facility]:    Telephone call to patient who gave HIPPA verification. Patient voices that she currently lives with spouse in [place]. Address: [address]; [address]. [place], [place] [address]  Voices that she has primary care provider Beckey Rutter, DO) as well as cardiologist & endocrinologist in [place]. States she attends appointments in [place], [place].  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  States she was living in [place], [place] [address] and does not currently know when she will be moving back.  States when living in [place]-primary care provider Dr. Desma Maxim in [place] which she saw several times with last visit 12/12/2015.   States she is currently on very strict routine for eating, taking medications & checking blood sugar due to drops in blood sugar. States she is currently keeping daily log for taking medications & eating. States she fixes medications in pill box which keeps it organized.  States she is currently in outpatient diabetes program where she can ask questions about her condition & is able to get valuable information even though she does not have diabetes.  Patient states she is trying to get approval from her insurance company for medical nutrition therapy.   Patient is requesting case management services for management of her health concerns.   Patient was advised that she is not eligible for [facility] care management services.  She was referred back to her primary care provider in [place] for health management concerns. Patient voices understanding.   Plan: Send to care management assistant to close case.  Colleen Can, RN BSN CCM Care Management Coordinator [facility] Care Management  [phone]

## 2017-01-28 DIAGNOSIS — K219 Gastro-esophageal reflux disease without esophagitis: Secondary | ICD-10-CM | POA: Diagnosis not present

## 2017-01-28 DIAGNOSIS — F419 Anxiety disorder, unspecified: Secondary | ICD-10-CM | POA: Diagnosis not present

## 2017-01-28 DIAGNOSIS — I4891 Unspecified atrial fibrillation: Secondary | ICD-10-CM | POA: Diagnosis not present

## 2017-01-28 DIAGNOSIS — M545 Low back pain: Secondary | ICD-10-CM | POA: Diagnosis not present

## 2017-01-28 DIAGNOSIS — I2510 Atherosclerotic heart disease of native coronary artery without angina pectoris: Secondary | ICD-10-CM | POA: Diagnosis not present

## 2017-01-28 DIAGNOSIS — R079 Chest pain, unspecified: Secondary | ICD-10-CM | POA: Diagnosis not present

## 2017-01-28 DIAGNOSIS — Z79899 Other long term (current) drug therapy: Secondary | ICD-10-CM | POA: Diagnosis not present

## 2017-01-28 DIAGNOSIS — J9811 Atelectasis: Secondary | ICD-10-CM | POA: Diagnosis not present

## 2017-01-29 DIAGNOSIS — I2510 Atherosclerotic heart disease of native coronary artery without angina pectoris: Secondary | ICD-10-CM | POA: Diagnosis not present

## 2017-01-29 DIAGNOSIS — I4891 Unspecified atrial fibrillation: Secondary | ICD-10-CM | POA: Diagnosis not present

## 2017-02-03 DIAGNOSIS — E161 Other hypoglycemia: Secondary | ICD-10-CM | POA: Diagnosis not present

## 2017-02-03 DIAGNOSIS — E519 Thiamine deficiency, unspecified: Secondary | ICD-10-CM | POA: Diagnosis not present

## 2017-02-03 DIAGNOSIS — R413 Other amnesia: Secondary | ICD-10-CM | POA: Diagnosis not present

## 2017-02-03 DIAGNOSIS — E038 Other specified hypothyroidism: Secondary | ICD-10-CM | POA: Diagnosis not present

## 2017-02-03 DIAGNOSIS — F329 Major depressive disorder, single episode, unspecified: Secondary | ICD-10-CM | POA: Diagnosis not present

## 2017-02-03 DIAGNOSIS — R0602 Shortness of breath: Secondary | ICD-10-CM | POA: Diagnosis not present

## 2017-02-03 DIAGNOSIS — R0789 Other chest pain: Secondary | ICD-10-CM | POA: Diagnosis not present

## 2017-02-03 DIAGNOSIS — F410 Panic disorder [episodic paroxysmal anxiety] without agoraphobia: Secondary | ICD-10-CM | POA: Diagnosis not present

## 2017-02-03 DIAGNOSIS — R0609 Other forms of dyspnea: Secondary | ICD-10-CM | POA: Diagnosis not present

## 2017-02-03 DIAGNOSIS — Z6841 Body Mass Index (BMI) 40.0 and over, adult: Secondary | ICD-10-CM | POA: Diagnosis not present

## 2017-02-03 DIAGNOSIS — F419 Anxiety disorder, unspecified: Secondary | ICD-10-CM | POA: Diagnosis not present

## 2017-02-03 DIAGNOSIS — F29 Unspecified psychosis not due to a substance or known physiological condition: Secondary | ICD-10-CM | POA: Diagnosis not present

## 2017-02-03 DIAGNOSIS — Z87891 Personal history of nicotine dependence: Secondary | ICD-10-CM | POA: Diagnosis not present

## 2017-02-03 DIAGNOSIS — Z7902 Long term (current) use of antithrombotics/antiplatelets: Secondary | ICD-10-CM | POA: Diagnosis not present

## 2017-02-03 DIAGNOSIS — E063 Autoimmune thyroiditis: Secondary | ICD-10-CM | POA: Diagnosis not present

## 2017-02-03 DIAGNOSIS — Z8679 Personal history of other diseases of the circulatory system: Secondary | ICD-10-CM | POA: Diagnosis not present

## 2017-02-03 DIAGNOSIS — Z79899 Other long term (current) drug therapy: Secondary | ICD-10-CM | POA: Diagnosis not present

## 2017-02-03 DIAGNOSIS — R0600 Dyspnea, unspecified: Secondary | ICD-10-CM | POA: Diagnosis not present

## 2017-02-03 DIAGNOSIS — Z794 Long term (current) use of insulin: Secondary | ICD-10-CM | POA: Diagnosis not present

## 2017-02-04 DIAGNOSIS — I4891 Unspecified atrial fibrillation: Secondary | ICD-10-CM | POA: Diagnosis not present

## 2017-02-04 DIAGNOSIS — R0602 Shortness of breath: Secondary | ICD-10-CM | POA: Diagnosis not present

## 2017-02-05 DIAGNOSIS — Z9181 History of falling: Secondary | ICD-10-CM | POA: Diagnosis not present

## 2017-02-05 DIAGNOSIS — F419 Anxiety disorder, unspecified: Secondary | ICD-10-CM | POA: Diagnosis not present

## 2017-02-05 DIAGNOSIS — Z6841 Body Mass Index (BMI) 40.0 and over, adult: Secondary | ICD-10-CM | POA: Diagnosis not present

## 2017-02-05 DIAGNOSIS — I7300 Raynaud's syndrome without gangrene: Secondary | ICD-10-CM | POA: Diagnosis not present

## 2017-02-05 DIAGNOSIS — M549 Dorsalgia, unspecified: Secondary | ICD-10-CM | POA: Diagnosis not present

## 2017-02-05 DIAGNOSIS — Z7901 Long term (current) use of anticoagulants: Secondary | ICD-10-CM | POA: Diagnosis not present

## 2017-02-05 DIAGNOSIS — R413 Other amnesia: Secondary | ICD-10-CM | POA: Diagnosis not present

## 2017-02-05 DIAGNOSIS — E038 Other specified hypothyroidism: Secondary | ICD-10-CM | POA: Diagnosis not present

## 2017-02-05 DIAGNOSIS — G4733 Obstructive sleep apnea (adult) (pediatric): Secondary | ICD-10-CM | POA: Diagnosis not present

## 2017-02-05 DIAGNOSIS — M7071 Other bursitis of hip, right hip: Secondary | ICD-10-CM | POA: Diagnosis not present

## 2017-02-05 DIAGNOSIS — F329 Major depressive disorder, single episode, unspecified: Secondary | ICD-10-CM | POA: Diagnosis not present

## 2017-02-05 DIAGNOSIS — I252 Old myocardial infarction: Secondary | ICD-10-CM | POA: Diagnosis not present

## 2017-02-05 DIAGNOSIS — I499 Cardiac arrhythmia, unspecified: Secondary | ICD-10-CM | POA: Diagnosis not present

## 2017-02-05 DIAGNOSIS — Z7982 Long term (current) use of aspirin: Secondary | ICD-10-CM | POA: Diagnosis not present

## 2017-02-05 DIAGNOSIS — E063 Autoimmune thyroiditis: Secondary | ICD-10-CM | POA: Diagnosis not present

## 2017-02-05 DIAGNOSIS — E161 Other hypoglycemia: Secondary | ICD-10-CM | POA: Diagnosis not present

## 2017-02-05 DIAGNOSIS — I2510 Atherosclerotic heart disease of native coronary artery without angina pectoris: Secondary | ICD-10-CM | POA: Diagnosis not present

## 2017-02-05 DIAGNOSIS — M7072 Other bursitis of hip, left hip: Secondary | ICD-10-CM | POA: Diagnosis not present

## 2017-02-05 DIAGNOSIS — I480 Paroxysmal atrial fibrillation: Secondary | ICD-10-CM | POA: Diagnosis not present

## 2017-02-09 DIAGNOSIS — Z9889 Other specified postprocedural states: Secondary | ICD-10-CM | POA: Diagnosis not present

## 2017-02-09 DIAGNOSIS — M25631 Stiffness of right wrist, not elsewhere classified: Secondary | ICD-10-CM | POA: Diagnosis not present

## 2017-02-09 DIAGNOSIS — E119 Type 2 diabetes mellitus without complications: Secondary | ICD-10-CM | POA: Diagnosis not present

## 2017-02-09 DIAGNOSIS — Z09 Encounter for follow-up examination after completed treatment for conditions other than malignant neoplasm: Secondary | ICD-10-CM | POA: Diagnosis not present

## 2017-02-09 DIAGNOSIS — R29898 Other symptoms and signs involving the musculoskeletal system: Secondary | ICD-10-CM | POA: Diagnosis not present

## 2017-02-09 DIAGNOSIS — Z7902 Long term (current) use of antithrombotics/antiplatelets: Secondary | ICD-10-CM | POA: Diagnosis not present

## 2017-02-09 DIAGNOSIS — F419 Anxiety disorder, unspecified: Secondary | ICD-10-CM | POA: Diagnosis not present

## 2017-02-09 DIAGNOSIS — M25531 Pain in right wrist: Secondary | ICD-10-CM | POA: Diagnosis not present

## 2017-02-15 DIAGNOSIS — G4733 Obstructive sleep apnea (adult) (pediatric): Secondary | ICD-10-CM | POA: Diagnosis not present

## 2017-02-19 DIAGNOSIS — E161 Other hypoglycemia: Secondary | ICD-10-CM | POA: Diagnosis not present

## 2017-02-19 DIAGNOSIS — R739 Hyperglycemia, unspecified: Secondary | ICD-10-CM | POA: Diagnosis not present

## 2017-02-19 DIAGNOSIS — F419 Anxiety disorder, unspecified: Secondary | ICD-10-CM | POA: Diagnosis not present

## 2017-02-19 DIAGNOSIS — N950 Postmenopausal bleeding: Secondary | ICD-10-CM | POA: Diagnosis not present

## 2017-02-19 DIAGNOSIS — Z6841 Body Mass Index (BMI) 40.0 and over, adult: Secondary | ICD-10-CM | POA: Diagnosis not present

## 2017-03-18 DIAGNOSIS — I2510 Atherosclerotic heart disease of native coronary artery without angina pectoris: Secondary | ICD-10-CM | POA: Diagnosis not present

## 2017-03-18 DIAGNOSIS — I428 Other cardiomyopathies: Secondary | ICD-10-CM | POA: Diagnosis not present

## 2017-03-18 DIAGNOSIS — E785 Hyperlipidemia, unspecified: Secondary | ICD-10-CM | POA: Diagnosis not present

## 2017-03-18 DIAGNOSIS — I10 Essential (primary) hypertension: Secondary | ICD-10-CM | POA: Diagnosis not present

## 2017-03-18 DIAGNOSIS — Z7982 Long term (current) use of aspirin: Secondary | ICD-10-CM | POA: Diagnosis not present

## 2017-03-18 DIAGNOSIS — Z79899 Other long term (current) drug therapy: Secondary | ICD-10-CM | POA: Diagnosis not present

## 2017-03-18 DIAGNOSIS — I480 Paroxysmal atrial fibrillation: Secondary | ICD-10-CM | POA: Diagnosis not present

## 2017-03-22 DIAGNOSIS — N83201 Unspecified ovarian cyst, right side: Secondary | ICD-10-CM | POA: Diagnosis not present

## 2017-03-22 DIAGNOSIS — N950 Postmenopausal bleeding: Secondary | ICD-10-CM | POA: Diagnosis not present

## 2017-03-22 DIAGNOSIS — N83202 Unspecified ovarian cyst, left side: Secondary | ICD-10-CM | POA: Diagnosis not present

## 2017-03-24 DIAGNOSIS — Z4509 Encounter for adjustment and management of other cardiac device: Secondary | ICD-10-CM | POA: Diagnosis not present

## 2017-04-13 DIAGNOSIS — R5383 Other fatigue: Secondary | ICD-10-CM | POA: Diagnosis not present

## 2017-04-13 DIAGNOSIS — I2510 Atherosclerotic heart disease of native coronary artery without angina pectoris: Secondary | ICD-10-CM | POA: Diagnosis not present

## 2017-04-13 DIAGNOSIS — I480 Paroxysmal atrial fibrillation: Secondary | ICD-10-CM | POA: Diagnosis not present

## 2017-04-13 DIAGNOSIS — Z862 Personal history of diseases of the blood and blood-forming organs and certain disorders involving the immune mechanism: Secondary | ICD-10-CM | POA: Diagnosis not present

## 2017-04-13 DIAGNOSIS — E785 Hyperlipidemia, unspecified: Secondary | ICD-10-CM | POA: Diagnosis not present

## 2017-04-13 DIAGNOSIS — I429 Cardiomyopathy, unspecified: Secondary | ICD-10-CM | POA: Diagnosis not present

## 2017-04-13 DIAGNOSIS — Z7189 Other specified counseling: Secondary | ICD-10-CM | POA: Diagnosis not present

## 2017-04-13 DIAGNOSIS — G4733 Obstructive sleep apnea (adult) (pediatric): Secondary | ICD-10-CM | POA: Diagnosis not present

## 2017-04-19 DIAGNOSIS — F419 Anxiety disorder, unspecified: Secondary | ICD-10-CM | POA: Diagnosis not present

## 2017-04-19 DIAGNOSIS — Z87891 Personal history of nicotine dependence: Secondary | ICD-10-CM | POA: Diagnosis not present

## 2017-04-19 DIAGNOSIS — R55 Syncope and collapse: Secondary | ICD-10-CM | POA: Diagnosis not present

## 2017-04-19 DIAGNOSIS — I2510 Atherosclerotic heart disease of native coronary artery without angina pectoris: Secondary | ICD-10-CM | POA: Diagnosis not present

## 2017-04-19 DIAGNOSIS — I4891 Unspecified atrial fibrillation: Secondary | ICD-10-CM | POA: Diagnosis not present

## 2017-04-19 DIAGNOSIS — E538 Deficiency of other specified B group vitamins: Secondary | ICD-10-CM | POA: Diagnosis not present

## 2017-04-19 DIAGNOSIS — Z7901 Long term (current) use of anticoagulants: Secondary | ICD-10-CM | POA: Diagnosis not present

## 2017-04-19 DIAGNOSIS — I10 Essential (primary) hypertension: Secondary | ICD-10-CM | POA: Diagnosis not present

## 2017-04-19 DIAGNOSIS — J9811 Atelectasis: Secondary | ICD-10-CM | POA: Diagnosis not present

## 2017-04-19 DIAGNOSIS — E039 Hypothyroidism, unspecified: Secondary | ICD-10-CM | POA: Diagnosis not present

## 2017-04-19 DIAGNOSIS — R42 Dizziness and giddiness: Secondary | ICD-10-CM | POA: Diagnosis not present

## 2017-04-19 DIAGNOSIS — Z79899 Other long term (current) drug therapy: Secondary | ICD-10-CM | POA: Diagnosis not present

## 2017-05-11 DIAGNOSIS — Z4509 Encounter for adjustment and management of other cardiac device: Secondary | ICD-10-CM | POA: Diagnosis not present

## 2017-05-25 DIAGNOSIS — Z8669 Personal history of other diseases of the nervous system and sense organs: Secondary | ICD-10-CM | POA: Diagnosis not present

## 2017-05-25 DIAGNOSIS — H353131 Nonexudative age-related macular degeneration, bilateral, early dry stage: Secondary | ICD-10-CM | POA: Diagnosis not present

## 2017-05-25 DIAGNOSIS — H25043 Posterior subcapsular polar age-related cataract, bilateral: Secondary | ICD-10-CM | POA: Diagnosis not present

## 2017-05-25 DIAGNOSIS — H2513 Age-related nuclear cataract, bilateral: Secondary | ICD-10-CM | POA: Diagnosis not present

## 2017-05-25 DIAGNOSIS — H31091 Other chorioretinal scars, right eye: Secondary | ICD-10-CM | POA: Diagnosis not present

## 2017-05-27 DIAGNOSIS — G4733 Obstructive sleep apnea (adult) (pediatric): Secondary | ICD-10-CM | POA: Diagnosis not present

## 2017-06-10 DIAGNOSIS — Z6841 Body Mass Index (BMI) 40.0 and over, adult: Secondary | ICD-10-CM | POA: Diagnosis not present

## 2017-06-10 DIAGNOSIS — N764 Abscess of vulva: Secondary | ICD-10-CM | POA: Diagnosis not present

## 2017-06-18 DIAGNOSIS — F419 Anxiety disorder, unspecified: Secondary | ICD-10-CM | POA: Diagnosis not present

## 2017-06-18 DIAGNOSIS — N764 Abscess of vulva: Secondary | ICD-10-CM | POA: Diagnosis not present

## 2017-06-18 DIAGNOSIS — E538 Deficiency of other specified B group vitamins: Secondary | ICD-10-CM | POA: Diagnosis not present

## 2017-06-18 DIAGNOSIS — R5383 Other fatigue: Secondary | ICD-10-CM | POA: Diagnosis not present

## 2017-07-13 DIAGNOSIS — M25512 Pain in left shoulder: Secondary | ICD-10-CM | POA: Diagnosis not present

## 2017-07-13 DIAGNOSIS — F419 Anxiety disorder, unspecified: Secondary | ICD-10-CM | POA: Diagnosis not present

## 2017-07-27 DIAGNOSIS — R609 Edema, unspecified: Secondary | ICD-10-CM | POA: Diagnosis not present

## 2017-07-27 DIAGNOSIS — I2510 Atherosclerotic heart disease of native coronary artery without angina pectoris: Secondary | ICD-10-CM | POA: Diagnosis not present

## 2017-07-27 DIAGNOSIS — I10 Essential (primary) hypertension: Secondary | ICD-10-CM | POA: Diagnosis not present

## 2017-07-27 DIAGNOSIS — I480 Paroxysmal atrial fibrillation: Secondary | ICD-10-CM | POA: Diagnosis not present

## 2017-08-10 DIAGNOSIS — Z4509 Encounter for adjustment and management of other cardiac device: Secondary | ICD-10-CM | POA: Diagnosis not present

## 2017-08-11 DIAGNOSIS — Z6841 Body Mass Index (BMI) 40.0 and over, adult: Secondary | ICD-10-CM | POA: Diagnosis not present

## 2017-08-11 DIAGNOSIS — E161 Other hypoglycemia: Secondary | ICD-10-CM | POA: Diagnosis not present

## 2017-08-11 DIAGNOSIS — N764 Abscess of vulva: Secondary | ICD-10-CM | POA: Diagnosis not present

## 2017-08-11 DIAGNOSIS — E038 Other specified hypothyroidism: Secondary | ICD-10-CM | POA: Diagnosis not present

## 2017-08-11 DIAGNOSIS — E669 Obesity, unspecified: Secondary | ICD-10-CM | POA: Diagnosis not present

## 2017-08-11 DIAGNOSIS — E538 Deficiency of other specified B group vitamins: Secondary | ICD-10-CM | POA: Diagnosis not present

## 2017-08-11 DIAGNOSIS — E063 Autoimmune thyroiditis: Secondary | ICD-10-CM | POA: Diagnosis not present

## 2017-08-19 DIAGNOSIS — G4733 Obstructive sleep apnea (adult) (pediatric): Secondary | ICD-10-CM | POA: Diagnosis not present

## 2017-08-28 DIAGNOSIS — Z881 Allergy status to other antibiotic agents status: Secondary | ICD-10-CM | POA: Diagnosis not present

## 2017-08-28 DIAGNOSIS — Z79899 Other long term (current) drug therapy: Secondary | ICD-10-CM | POA: Diagnosis not present

## 2017-08-28 DIAGNOSIS — Z87891 Personal history of nicotine dependence: Secondary | ICD-10-CM | POA: Diagnosis not present

## 2017-08-28 DIAGNOSIS — Z6841 Body Mass Index (BMI) 40.0 and over, adult: Secondary | ICD-10-CM | POA: Diagnosis not present

## 2017-08-28 DIAGNOSIS — R0789 Other chest pain: Secondary | ICD-10-CM | POA: Diagnosis not present

## 2017-08-28 DIAGNOSIS — R0602 Shortness of breath: Secondary | ICD-10-CM | POA: Diagnosis not present

## 2017-08-28 DIAGNOSIS — I480 Paroxysmal atrial fibrillation: Secondary | ICD-10-CM | POA: Diagnosis not present

## 2017-08-28 DIAGNOSIS — K219 Gastro-esophageal reflux disease without esophagitis: Secondary | ICD-10-CM | POA: Diagnosis not present

## 2017-08-28 DIAGNOSIS — Z7902 Long term (current) use of antithrombotics/antiplatelets: Secondary | ICD-10-CM | POA: Diagnosis not present

## 2017-08-28 DIAGNOSIS — I252 Old myocardial infarction: Secondary | ICD-10-CM | POA: Diagnosis not present

## 2017-08-28 DIAGNOSIS — I2510 Atherosclerotic heart disease of native coronary artery without angina pectoris: Secondary | ICD-10-CM | POA: Diagnosis not present

## 2017-08-28 DIAGNOSIS — Z7982 Long term (current) use of aspirin: Secondary | ICD-10-CM | POA: Diagnosis not present

## 2017-09-03 DIAGNOSIS — I10 Essential (primary) hypertension: Secondary | ICD-10-CM | POA: Diagnosis not present

## 2017-09-03 DIAGNOSIS — I480 Paroxysmal atrial fibrillation: Secondary | ICD-10-CM | POA: Diagnosis not present

## 2017-09-03 DIAGNOSIS — Z95818 Presence of other cardiac implants and grafts: Secondary | ICD-10-CM | POA: Diagnosis not present

## 2017-09-03 DIAGNOSIS — I2510 Atherosclerotic heart disease of native coronary artery without angina pectoris: Secondary | ICD-10-CM | POA: Diagnosis not present

## 2017-09-29 DIAGNOSIS — Z4509 Encounter for adjustment and management of other cardiac device: Secondary | ICD-10-CM | POA: Diagnosis not present

## 2018-05-24 IMAGING — US US EXTREM LOW VENOUS*L*
1 series · 13 of 24 positions shown · non-contrast
Comparison: None.

CLINICAL DATA: Left thigh pain and swelling for the past 2 weeks.
Evaluate for DVT.



[Series 1: us extrem low venous*left* · 0.08mm/px · 13 of 32 slices shown]
[im 1/32]
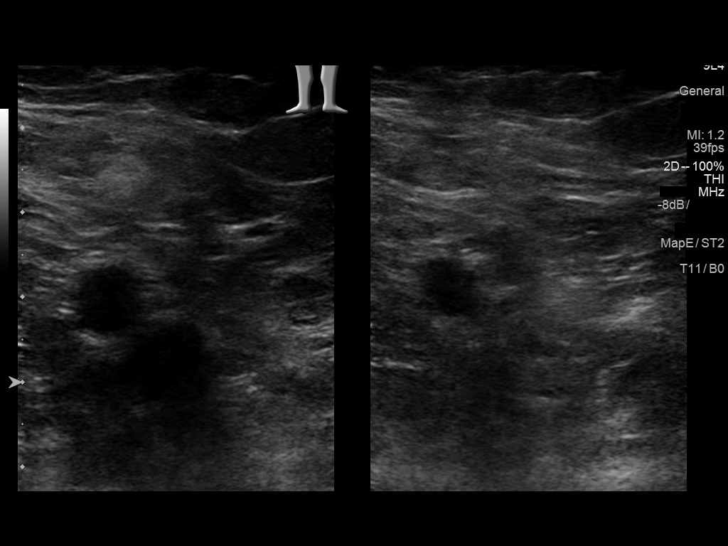
[im 3/32]
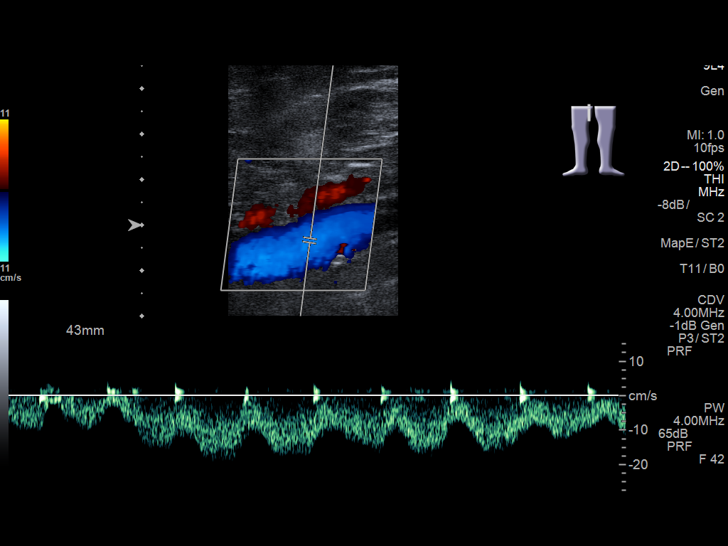
[im 6/32]
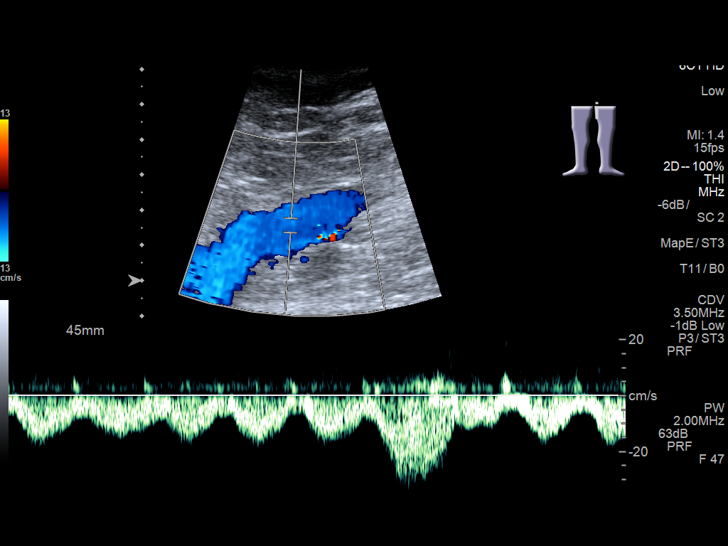
[im 9/32]
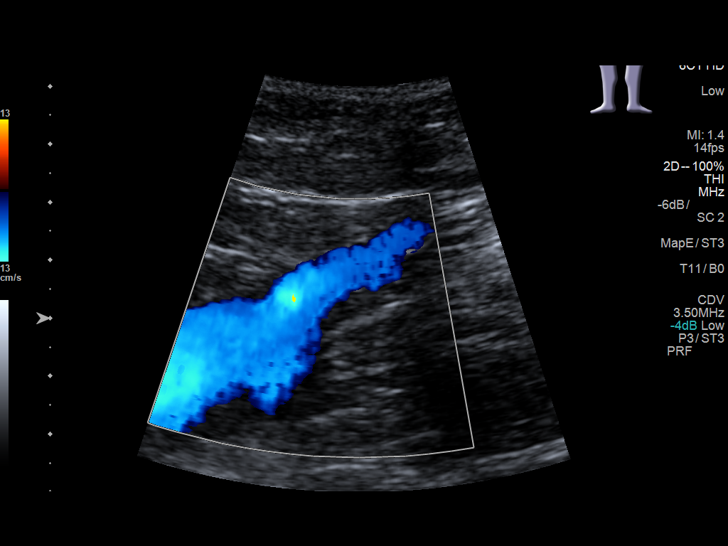
[im 11/32]
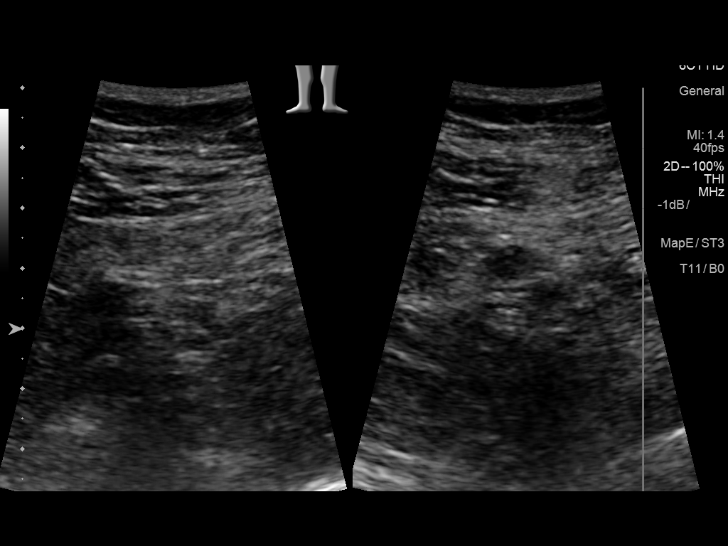
[im 14/32]
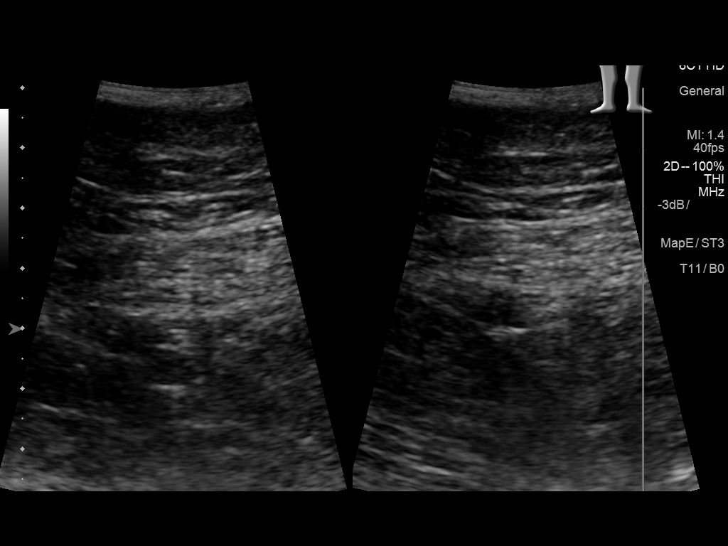
[im 17/32]
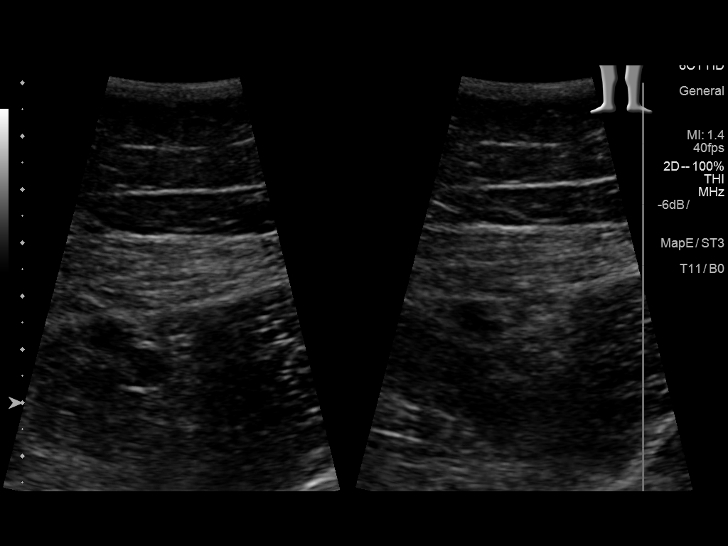
[im 18/32]
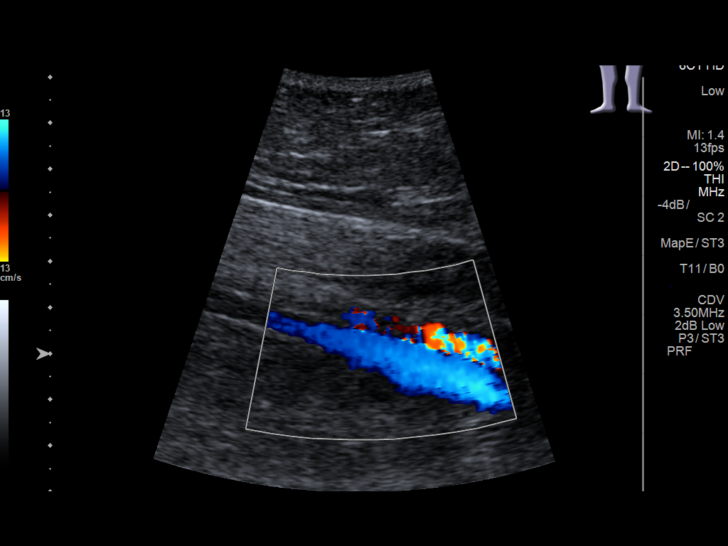
[im 21/32]
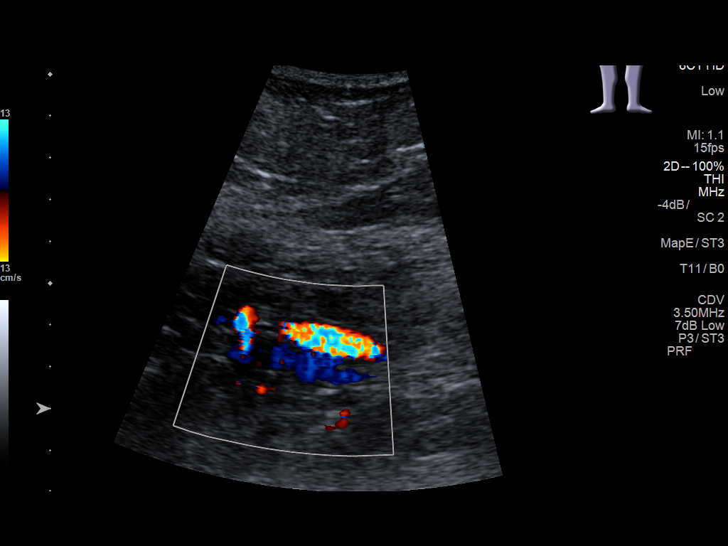
[im 23/32]
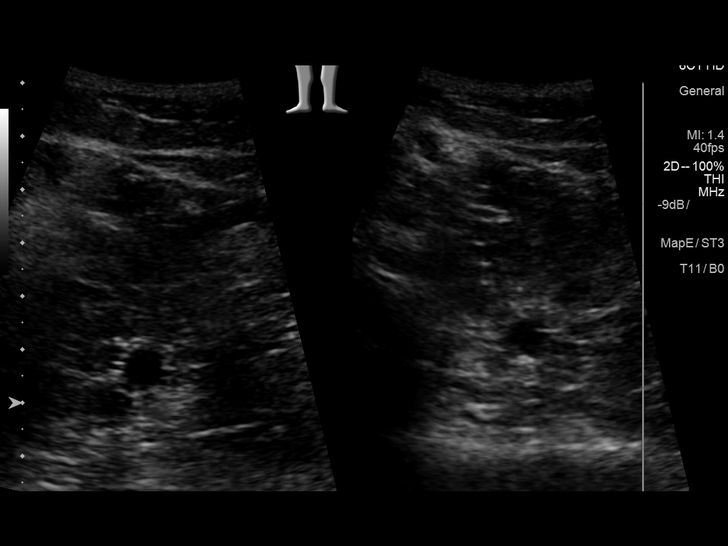
[im 26/32]
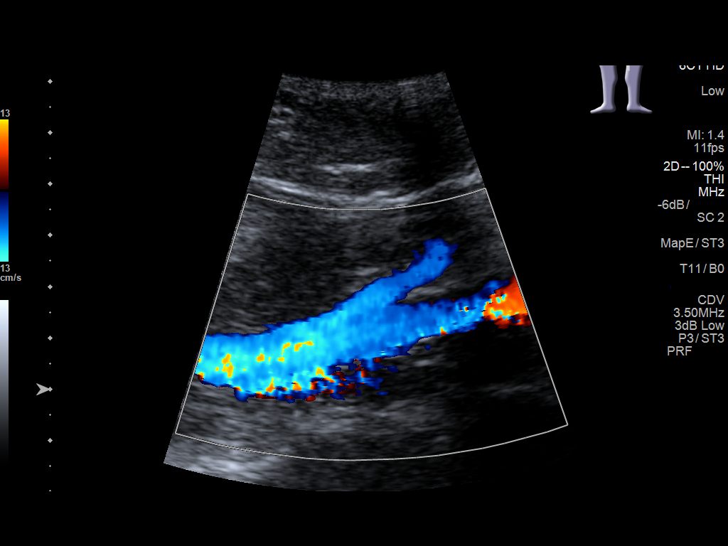
[im 29/32]
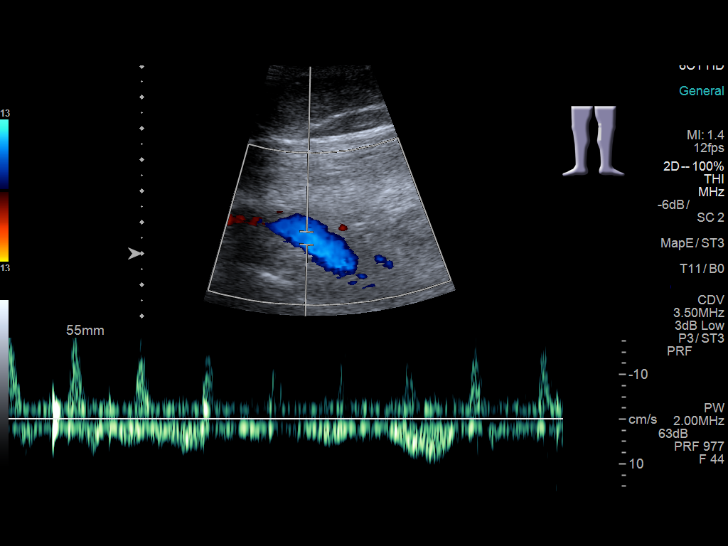
[im 32/32]
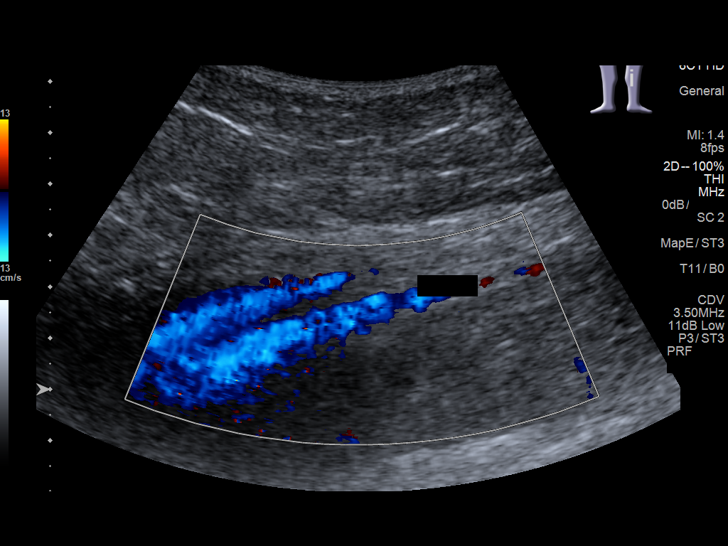

[13 of 24 positions shown; findings below may reference images not displayed]

FINDINGS: Contralateral Common Femoral Vein: Respiratory phasicity is normal
and symmetric with the symptomatic side. No evidence of thrombus.
Normal compressibility.

Common Femoral Vein: No evidence of thrombus. Normal
compressibility, respiratory phasicity and response to augmentation.

Saphenofemoral Junction: No evidence of thrombus. Normal
compressibility and flow on color Doppler imaging.

Profunda Femoral Vein: No evidence of thrombus. Normal
compressibility and flow on color Doppler imaging.

Femoral Vein: No evidence of thrombus. Normal compressibility,
respiratory phasicity and response to augmentation.

Popliteal Vein: No evidence of thrombus. Normal compressibility,
respiratory phasicity and response to augmentation.

Calf Veins: No evidence of thrombus. Normal compressibility and flow
on color Doppler imaging.

Superficial Great Saphenous Vein: No evidence of thrombus. Normal
compressibility and flow on color Doppler imaging.

Venous Reflux:  None.

Other Findings:  None.
IMPRESSION: No evidence of DVT within the left lower extremity.
